# Patient Record
Sex: Female | Born: 1980
Health system: Southern US, Community
[De-identification: ages and names within clinical notes are randomized; demographics above are authoritative.]

## PROBLEM LIST (undated history)

## (undated) DIAGNOSIS — I10 Essential (primary) hypertension: Secondary | ICD-10-CM

## (undated) DIAGNOSIS — R634 Abnormal weight loss: Secondary | ICD-10-CM

## (undated) DIAGNOSIS — R42 Dizziness and giddiness: Secondary | ICD-10-CM

## (undated) DIAGNOSIS — R0789 Other chest pain: Secondary | ICD-10-CM

## (undated) HISTORY — DX: Essential (primary) hypertension: I10

## (undated) HISTORY — DX: Dizziness and giddiness: R42

## (undated) HISTORY — DX: Abnormal weight loss: R63.4

## (undated) HISTORY — DX: Other chest pain: R07.89

## (undated) HISTORY — PX: CARDIAC SURGERY: SHX584

---

## 2011-03-26 HISTORY — PX: ATRIAL SEPTAL DEFECT(ASD) CLOSURE: CATH118299

## 2014-12-15 ENCOUNTER — Ambulatory Visit (INDEPENDENT_AMBULATORY_CARE_PROVIDER_SITE_OTHER): Payer: 59 | Admitting: Physician Assistant

## 2014-12-15 VITALS — BP 146/100 | HR 68 | Temp 98.2°F | Resp 16 | Ht 64.0 in | Wt 132.4 lb

## 2014-12-15 DIAGNOSIS — Q211 Atrial septal defect, unspecified: Secondary | ICD-10-CM

## 2014-12-15 DIAGNOSIS — I10 Essential (primary) hypertension: Secondary | ICD-10-CM | POA: Diagnosis not present

## 2014-12-15 DIAGNOSIS — G44209 Tension-type headache, unspecified, not intractable: Secondary | ICD-10-CM | POA: Diagnosis not present

## 2014-12-15 LAB — BASIC METABOLIC PANEL
BUN: 13 mg/dL (ref 7–25)
CALCIUM: 9.4 mg/dL (ref 8.6–10.2)
CO2: 26 mmol/L (ref 20–31)
CREATININE: 0.62 mg/dL (ref 0.50–1.10)
Chloride: 105 mmol/L (ref 98–110)
GLUCOSE: 81 mg/dL (ref 65–99)
Potassium: 4.3 mmol/L (ref 3.5–5.3)
Sodium: 139 mmol/L (ref 135–146)

## 2014-12-15 MED ORDER — LOSARTAN POTASSIUM 50 MG PO TABS: 50.0000 mg | ORAL_TABLET | Freq: Every day | ORAL | Status: DC

## 2014-12-15 NOTE — Patient Instructions (Signed)
We have started you on losartan 50 mg daily.  We are checking your kidneys and electrolytes today prior to starting this.  I have referred you to cardiology in order to establish care for them for your hypertension and history of ASD.  If your headache worsens or does not resolve in the next few days please come back to see [place].  Please check your blood pressure at home and let us know if it is consistently running above 140/90.

## 2014-12-15 NOTE — Progress Notes (Signed)
   Subjective:    Patient ID: Deanna Davis, female    DOB: 12/21/1980, 34 y.o.   MRN: [id number]  Chief Complaint  Patient presents with  . Headache    x 3 days, recently moved here says she notices it when her BP is high  . blood pressure check    [place] a doctor out of the country but moved before getting on medication   Medications, allergies, past medical history, surgical history, family history, social history and problem list reviewed and updated.  HPI  34 yof presents with htn and HA.   Recently moved here from [place] Had preeclampsia during her only pregnancy 6 yrs ago, had htn post delivery as well. Took losartan for 1-2 years which worked well with no SEs. Stopped taking it several yrs ago as bp was so well controlled.   Today she states bp at home has been running 150s/90s past couple wks. Denies cp, sob. Has had left temporal area HA past 2 days. Gradual onset. Rated 7/10. No radiation. Has been constant but relieved with advil yest. Currently 5/10. No assoc vision changes. No scalp tenderness or jaw claudication. Not worst HA of life. Denies fevers, chills.   PMH - ASD repaired with mesh in [place] 2011.   Review of Systems See HPI     Objective:   Physical Exam  Constitutional: She is oriented to person, place, and time.  BP 146/100 mmHg  Pulse 68  Temp(Src) 98.2 F (36.8 C) (Oral)  Resp 16  Ht  (1.626 m)  Wt 132 lb 6.4 oz (60.056 kg)  BMI 22.72 kg/m2  SpO2 99%  LMP 12/08/2014   HENT:  No scalp tenderness on exam.   Eyes: Conjunctivae and EOM are normal. Pupils are equal, round, and reactive to light.  Neck: No Brudzinski's sign noted.  Cardiovascular: Normal rate, regular rhythm and normal heart sounds.   Pulmonary/Chest: Effort normal and breath sounds normal.  Lymphadenopathy:       Head (right side): No submental, no submandibular and no tonsillar adenopathy present.       Head (left side): No submental, no submandibular and no  tonsillar adenopathy present.    She has no cervical adenopathy.  Neurological: She is alert and oriented to person, place, and time. No cranial nerve deficit.  Skin: Skin is warm and dry. No rash noted.  Psychiatric: She has a normal mood and affect. Her speech is normal and behavior is normal.      Assessment & Plan:   Essential hypertension - Plan: Basic metabolic panel, losartan (COZAAR) 50 MG tablet, Ambulatory referral to Cardiology  Tension-type headache, not intractable, unspecified chronicity pattern  ASD (atrial septal defect) - Plan: Ambulatory referral to Cardiology --will start losartan for htn as has been on previously on tolerated well, bmp today --HA most likey tension or bp related, continue nsaids prn, rtc if no relief 3-4 days or if worsens --referral to cardiology for initial appt as she is new to the area and has htn at 34 age along with a treated ASD --encouraged to see [place] in return for a complete physical, agreeable  Donnajean Lopes, PA-C Physician Assistant-Certified [facility]  12/15/2014 1:16 PM

## 2014-12-24 ENCOUNTER — Ambulatory Visit (INDEPENDENT_AMBULATORY_CARE_PROVIDER_SITE_OTHER): Payer: 59 | Admitting: Family Medicine

## 2014-12-24 VITALS — BP 132/82 | HR 83 | Temp 98.3°F | Resp 14 | Ht 63.5 in | Wt 129.0 lb

## 2014-12-24 DIAGNOSIS — I10 Essential (primary) hypertension: Secondary | ICD-10-CM | POA: Diagnosis not present

## 2014-12-24 MED ORDER — HYDROCHLOROTHIAZIDE 25 MG PO TABS: 25.0000 mg | ORAL_TABLET | Freq: Every day | ORAL | Status: DC

## 2014-12-24 NOTE — Progress Notes (Signed)
   Subjective:  This chart was scribed for Norberto Sorenson, MD by [facility], medical scribe at [facility].The patient was seen in exam room 02 and the patient's care was started at 3:47 PM.   Patient ID: Deanna Davis, female    DOB: 16-Jan-1981, 34 y.o.   MRN: [id number] Chief Complaint  Patient presents with  . Blood Pressure Check  . Headache   HPI HPI Comments: Deanna Davis is a 34 y.o. female with a hx of HTN who presents to [facility] complaining of a headache and elevated BP. Taking losartan 50 mg once daily. BP read at home daily. Initially ranging 140/90 past 3 day high 130/high 80. Taking a contraceptive, no plan on pregnancy. No increased urinary frequency. Appointment with cardiology next week.   Past Medical History  Diagnosis Date  . Hypertension    Current Outpatient Prescriptions on File Prior to Visit  Medication Sig Dispense Refill  . losartan (COZAAR) 50 MG tablet Take 1 tablet (50 mg total) by mouth daily. 90 tablet 3   No current facility-administered medications on file prior to visit.   No Known Allergies  Review of Systems  Genitourinary: Negative for frequency.  Neurological: Positive for headaches.      Objective:  BP 132/82 mmHg  Pulse 83  Temp(Src) 98.3 F (36.8 C) (Oral)  Resp 14  Ht 5' 3.5" (1.613 m)  Wt 129 lb (58.514 kg)  BMI 22.49 kg/m2  SpO2 99%  LMP 12/08/2014 Physical Exam  Constitutional: She is oriented to person, place, and time. She appears well-developed and well-nourished. No distress.  HENT:  Head: Normocephalic and atraumatic.  Eyes: Pupils are equal, round, and reactive to light.  Neck: Normal range of motion. Neck supple. No thyromegaly present.  Cardiovascular: Normal rate, regular rhythm, S1 normal, S2 normal and normal heart sounds.  Exam reveals no gallop and no friction rub.   No murmur heard. Pulmonary/Chest: Effort normal and breath sounds normal. No respiratory distress.    Genitourinary: No vaginal discharge found.  Musculoskeletal: Normal range of motion.  Lymphadenopathy:    She has no cervical adenopathy.  Neurological: She is alert and oriented to person, place, and time.  Skin: Skin is warm and dry.  Psychiatric: She has a normal mood and affect. Her behavior is normal.  Nursing note and vitals reviewed.     Assessment & Plan:   1. Essential hypertension   Continue losartan 50, start hctz 25. Lots of water, cont low salt diet, increase potassium in diet - info given.  Reviewed teratogenicity of losartan - pt aware and is very sure she will not become pregnant.  Cont to monitor BP at home - goal 110/70 due to young age, lack of other risk factors, and prior ASD repair. Has appt w/ cardiology sched for 9/19 of which pt is aware.  Can combine to combo pill when stable on regimen.  Pt's primary language is Spanish but speaks [place] well  Meds ordered this encounter  Medications  . hydrochlorothiazide (HYDRODIURIL) 25 MG tablet    Sig: Take 1 tablet (25 mg total) by mouth daily.    Dispense:  30 tablet    Refill:  2    I personally performed the services described in this documentation, which was scribed in my presence. The recorded information has been reviewed and considered, and addended by me as needed.  Norberto Sorenson, MD MPH

## 2014-12-24 NOTE — Patient Instructions (Signed)
Continue taking the losartan every morning and start taking the hctz every morning as well.  We want your blood pressure to be around 110/70.  Make sure you are drinking plenty of water and getting lots of high potassium foods in your diet.  Remember to keep on reading the sodium labels on your food.  Good job on keeping the BP log - continue this.  When you are on a good dose of medication and your blood pressure is where we want it and you are not having medication side effects, we can combine your blood pressure pills into 1 usually so that you can be on the same medications but only 1 pill a day/ 1 copay a month. Remember to stay on birth control - getting pregnant on losartan causes fetal malformations that can be deadly to the fetus  Contenido de potasio de los alimentos (Potassium Content of Foods) El potasio es un mineral que se encuentra en muchos alimentos y bebidas. Ayuda a Radio producer equilibrio de lquidos y Kellogg organismo, e influye en la regularidad con la que el corazn late. El potasio tambin ayuda a Chief Operating Officer la presin arterial y a Programme researcher, broadcasting/film/video y Nelson. Algunos medicamentos y afecciones pueden cambiar el equilibrio de potasio en el cuerpo. Cuando esto sucede, puede ayudar a Boeing de potasio a travs de los alimentos que consume. El mdico o el nutricionista le recomendarn la cantidad de potasio que debe ingerir por Futures trader. Las siguientes listas proporcionan la cantidad de potasio (entre parntesis) por porcin en cada alimento. CON ALTO CONTENIDO DE POTASIO  Los siguientes alimentos y bebidas tienen 200 mg o ms de potasio por porcin:  Damascos, 2 crudos o 5 secos (200 mg)  Alcachofa, 1 mediana (345 mg)  Aguacate, crudo, 1/4 (245 mg)  Banana, 1 mediana (425 mg)  Frijoles, lima o frijoles en salsa de tomates, enlatados, 1/2 taza (280 mg)  Frijoles blancos, enlatados, 1/2 taza (595 mg)  Carne asada, 3 onzas/85 g (320  mg)  Carne molida, 3 onzas/85 g (270 mg)  Remolachas, crudas o cocidas, 1/2 taza (260 mg)  Bollitos de salvado, 2 onzas/57 g (300 mg)  Brcoli, 1/2 taza (230 mg)  Repollitos de Bruselas, 1/2 taza (250 mg)  Meln, 1/2 taza (215 mg)  Cereales, 100 % de salvado, 1/2 taza (200 a 400 mg)  Hamburguesa de queso, sola, comida rpida, 1 (225 a 400 mg)  Pollo, 3 onzas/85 g (220 mg)  Almejas, enlatadas, 3 onzas/85 g (535 mg)  Cangrejo, 3 onzas/85 g (225 mg)  Dtiles, 5 (270 mg)  Frijoles y guisantes secos, 1/2 taza (300 a 475 mg)  Higos, secos, 2 (260 mg)  Pescado: halibut, atn, bacalao, pargo, 3 onzas/85 g (480 mg)  Pescado: salmn, abadejo, pez espada, perca, 3 onzas/85 g (300 mg)  Pescado: atn, enlatado, 3 onzas/85 g (200 mg)  Papas fritas, comida rpida, 3 onzas/85 g (470 mg)  Granola con frutas y frutos secos, 1/2 taza (200 mg)  Jugo de pomelo, 1/2 taza (200 mg)  Hojas verdes de Psychiatric nurse, 1/2 taza (655 mg)  Meln dulce, 1/2 taza (200 mg)  Col rizada, 1 taza (300 mg)  Kiwi, 1 mediano (240 mg)  Colirrbano, colinabo, chiriva, 1/2 taza (280 mg)  Lentejas, 1/2 taza (365 mg)  Mango, 1 (325 mg)  Leche con chocolate, 1 taza (420 mg)  Leche: descremada, parcialmente descremada, entera, suero de [place], 1 taza (350 a 380 mg)  Melaza, 1 cucharada (  295 mg)  Championes, 1/2 taza (280 mg)  Nectarina, 1 (275 mg)  Frutos secos: almendras, manes, avellanas, nueces de [place], castaas de caj, mezclados, 1 onza/28 g (200 mg)  Frutos secos: pistachos, 1 onza/28 g (295 mg)  Naranja, 1 (240 mg)  Jugo de naranja, 1/2 taza (235 mg)  Papaya, mediana, 1/2 fruta (390 mg)  Mantequilla de man, con trozos, 2 cucharadas (240 mg)  Mantequilla de man, sin trozos, 2 cucharadas (210 mg)  Pera, 1 mediana (200 mg)  Granada, 1 entera (400 mg)  Jugo de granada, 1/2 taza (215 mg)  Cerdo, 3 onzas/85 g (350 mg)  Papas fritas (de bolsa), 1 onza/85 g (465 mg)  Papa,  asada, con piel, 1 mediana (925 mg)  Papas, hervidas, 1/2 taza (255 mg)  Papas, pur, 1/2 taza (330 mg)  Jugo de ciruela, 1/2 taza (370 mg)  Ciruelas, 5 (305 mg)  Pudin de chocolate, 1/2 taza (230 mg)  Calabaza, enlatada, 1/2 taza (250 mg)  Pasas de uva, sin semilla, 1/4 taza (270 mg)  Semillas de girasol o calabaza, 1 onza/28 g (240 mg)  Leche de soja, 1 taza (300 mg)  Espinaca, 1/2 taza (420 mg)  Espinaca, enlatada, 1/2 taza (370 mg)  Batata, asada, con piel, 1 mediana (450 mg)  Acelga suiza, 1/2 taza (480 mg)  Jugo de tomate o verduras, 1/2 taza (275 mg)  Salsa o pur de tomates, 1/2 taza (400 a 550 mg)  Tomate, crudo, 1 mediano (290 mg)  Tomates, enlatados, 1/2 taza (200 a 300 mg)  Pavo, 3 onzas/85 g (250 mg)  Germen de trigo, 1 onza/28 g (250 mg)  Calabaza de invierno, 1/2 taza (250 mg)  Yogur, comn o con frutas, 6 onzas/177 ml (260 a 435 mg)  Calabacn, 1/2 taza (220 mg) CON CONTENIDO MODERADO DE POTASIO Los siguientes alimentos y bebidas tienen entre 50 y 200 mg de potasio por porcin:  Manzana, 1 (150 mg)  Jugo de [place], 1/2 taza (150 mg)  Pur de Psychologist, educational, 1/2 taza (90 mg)  Nctar de damasco, 1/2 taza (140 mg)  Esprragos, pequeos, 1/2 taza o 6 esprragos (155 mg)  Bagel con canela y pasas de uva, 1 (130 mg)  Bagel con huevo o comn, 4 pulgada/10 cm, 1 (70 mg)  Frijoles verdes, 1/2 taza (90 mg)  Frijoles amarillos, 1/2 taza (190 mg)  Cerveza, regular, 12 onzas/355 ml (100 mg)  Remolachas, enlatadas, 1/2 taza (125 mg)  Moras, 1/2 taza (115 mg)  Arndanos, 1/2 taza (60 mg)  Pan integral, 1 rebanada (70 mg)  Brcoli, crudo, 1/2 taza (145 mg)  Repollo, 1/2 taza (150 mg)  Zanahorias, crudas o cocidas, 1/2 taza (180 mg)  Coliflor, cruda, 1/2 taza (150 mg)  Apio, crudo, 1/2 taza (155 mg)  Cereales, copos de salvado, 1/2 taza (120 a 150 mg)  Requesn, 1/2 taza (110 mg)  Cerezas, 10 (150 mg)  Chocolate, barra de 1 1/2  onza/43 g (165 mg)  Caf, de cafetera, 6 onzas/177 ml (90 mg)  Maz, 1/2 taza o 1 espiga (195 mg)  Pepinos, 1/2 taza (80 mg)  Huevo, grande, 1 (60 mg)  Berenjena, 1/2 taza (60 mg)  Endivia, cruda, 1/2 taza (80 mg)  Muffin ingls, 1 (65 mg)    Cctel de frutas, 1/2 taza (115 mg)  Jugo de uvas, 1/2 taza (170 mg)  Pomelo, 1/2 (175 mg)  Uvas, 1/2 taza (155 mg)  Hojas verdes: col rizada, nabo, col, 1/2 taza (110 a 150 mg)  Helado  o yogur helado, de chocolate, 1/2 taza (175 mg)  Helado o yogur helado, de vainilla, 1/2 taza (120 a 150 mg)  Limones, limas, 1 (80 mg)  Lechuga, todos los tipos, 1 taza (100 mg)  Terex Corporation, 1/2 taza (150 mg)  Championes, crudos, 1/2 taza (110 mg)  Frutos secos: nueces, pacanas o macadamia, 1 onza/28 g (125mg )  Avena, 1/2 taza (80 mg)  Quimbomb, 1/2 taza (110 mg)  Cebolla, cruda, 1/2 taza (120 mg)  Durazno, 1 (185 mg)  Duraznos, enlatados, 1/2 taza (120 mg)  Peras, enlatadas, 1/2 taza (120 mg)  Guisantes, congelados, 1/2 taza (90 mg)  Pimientos, verdes, 1/2 taza (130 mg)  Pimientos, rojos, 1/2 taza (160 mg)  Jugo de pia, 1/2 taza (165 mg)  Pia, fresca o enlatada, 1/2 taza (100 mg)  Ciruelas, 1 (105 mg)  Pudin de vainilla, 1/2 taza (150 mg)  Frambuesas, 1/2 taza (90 mg)  Ruibardo, 1/2 taza (115 mg)  Arroz salvaje, 1/2 taza (80 mg)  Camarones, 3 onzas/85 g (155 mg)  Espinaca, cruda, 1 taza (170 mg)  Fresas, 1/2 taza (125 mg)  Calabaza de verano, 1/2 taza (175 a 200 mg)  Acelga suiza, cruda, 1 taza (135 mg)  Mandarina, 1 (140 mg)  T, 6 onzas/177 ml (65 mg)  Nabos, 1/2 taza (140 mg)  Sanda, 1/2 taza (85 mg)  Vino tinto, de mesa, 5 onzas/148 ml (180 mg)  Vino blanco, de mesa, 5 onzas/148 ml (100 mg) CON BAJO CONTENIDO DE POTASIO Los siguientes alimentos y bebidas tienen menos de 50 mg de potasio por porcin:  Pan blanco, 1 rebanada (30 g)  Bebidas gaseosas, 12 onzas/355 ml (menos de 5  mg)  Queso, 1 onza/28 g (20 a 30 mg)  Arndanos rojos, 1/2 taza (45 mg)  Cctel con jugo de arndanos rojos, 1/2 taza (20 mg)  Grasas y aceites, 1 cucharada (menos de 5 mg)  Hummus, 1 cucharada (32 mg)  Nctar: papaya, mango o pera, 1/2 taza (35 mg)  Arroz blanco o integral, 1/2 taza (50 mg)  Espaguetis o macarrones, cocidos, 1/2 taza (30 mg)  Tortilla de harina o maz, 1 (50 mg)  Waffle, 4 pulgadas/10 cm, 1 (50 mg)  Castaas de agua, 1/2 taza (40 mg) Document Released: 01/04/2012 Document Revised: 04/16/2013 [facility] Patient Information 2015 [place], [place]. This information is not intended to replace advice given to you by your health care provider. Make sure you discuss any questions you have with your health care provider.   Controle su presin arterial (Managing Your High Blood Pressure) La presin arterial es la medida de la fuerza de la sangre al presionar contra las paredes de las arterias. Las arterias son tubos musculares que estn dentro del sistema circulatorio. La presin arterial no es constante. Se eleva con la actividad, la excitacin o el nerviosismo y disminuye durante el sueo y Facilities manager. Si los valores de medicin de la presin arterial se mantienen por arriba de lo normal por [employer], hay riesgo de [address] La presin arterial alta (hipertensin) es una enfermedad de larga duracin (crnica) en la que la presin arterial est elevada.  La lectura de la presin arterial se registra con dos nmeros, por ejemplo 120 sobre 80 (o 120/80). El primer nmero, el ms alto, es la presin sistlica. Es la medida de la presin de las arterias cuando el corazn late. El segundo nmero, el ms bajo, es la presin diastlica. Es la medida de la presin en las arterias cuando  el corazn se relaja entre latidos.  Es importante Photographer presin arterial en un rango normal para Personal assistant en general y otros problemas de salud, como enfermedades  del corazn e ictus. Cuando no se controla la presin arterial, el corazn trabaja ms de lo normal. La hipertensin arterial es una enfermedad muy comn en los adultos debido a que tiende a Administrator, Civil Service con la edad. Hombres y mujeres son igualmente propensos a tener hipertensin, pero en diferentes momentos de la vida. Antes de los 45 aos, los hombres son ms propensos a sufrir hipertensin. Despus de 65 aos de edad, las mujeres tienen ms probabilidades de padecerla. La hipertensin es [employer] afroamericanos. Esta enfermedad generalmente no manifiesta signos ni sntomas. Generalmente se desconoce la causa. El mdico podr indicarle un plan para mantener la presin arterial en un rango normal y saludable.  ETAPAS DE PRESIN ARTERIAL La presin arterial se clasifica en cuatro etapas: normal, prehipertensin, etapa 1 y etapa 2. Se puede leer la presin arterial para determinar qu tipo de tratamiento, si se indicara, es necesario. Las opciones apropiadas para el tratamiento estn vinculadas a estas cuatro etapas:  Normal   Presin sistlica (mm Hg): por debajo de 120.  Presin diastlica (mm Hg): por debajo de 80. Prehipertensin   Presin sistlica (mm Hg): 120 a 139.  Presin diastlica (mm Hg): 80 a 89. Etapa1   Presin sistlica (mm Hg): 140 a 159.  Presin diastlica (mm Hg): 90 a 99. Etapa2   Presin sistlica (mm Hg): 160 o ms.  Presin diastlica (mm Hg): 100 o ms. RIESGOS RELACIONADOS CON LA PRESIN ARTERIAL ALTA Controlar la presin arterial es una responsabilidad importante. La hipertensin no controlada puede llevar a:   Ataques cardacos.  Ictus.  Debilitamiento de los vasos sanguneos (aneurisma).  Insuficiencia cardaca.  Dao renal.  Dao ocular.  Sndrome metablico.  Problemas de memoria y concentracin. CMO CONTROLAR LA PRESIN ARTERIAL La presin arterial puede ser controlada efectivamente con cambios en el estilo de vida y de medicamentos (si  es necesario). El Firefighter un plan para bajar la presin arterial al rango normal. Su plan debera incluir lo siguiente:  Educacin   Lea toda la informacin proporcionada por sus mdicos acerca de cmo controlar la presin arterial.  Infrmese sobre las ltimas recomendaciones de pautas y [place]. Continuamente se hacen nuevas investigaciones para definir con ms precisin los riesgos y los tratamientos para la hipertensin arterial. Cambiosen el estilo de vida  Control del [place].  No fumar.  Mantenerse fsicamente activo.  Disminuir la cantidad de sal de la dieta.  Reducir las situaciones de estrs.  Controlar las enfermedades crnicas, como el colesterol alto o la diabetes.  Reducir el consumo de alcohol. Medicamentos  Estn disponibles diferentes medicamentos (medicamentos antihipertensivos) para que la presin arterial quede dentro de un rango normal. Comunicacin   Revise con su mdico todos los medicamentos que toma ya que puede haber efectos secundarios o interacciones.  Hable con su mdico acerca de la dieta, hbitos de ejercicio y otros factores del estilo de vida que pueden contribuir a la hipertensin arterial.  Oceanographer regularmente a la consulta con el profesional. El mdico puede ayudarle a crear y Dawayne Patricia su plan para controlar la presin arterial alta. RECOMENDACIONES PARA EL TRATAMIENTO Y CONTROL  Las siguientes recomendaciones se basan en las pautas actuales para controlar la hipertensin arterial en adultos no gestantes. Utilice estas recomendaciones para determinar el perodo de seguimiento adecuado o la opcin de tratamiento basada  en la lectura de su presin arterial. Podr conversar sobre estas opciones con su mdico.   Presin sistlica de 120 a 139 o presin diastlica de 80 a 89: Concurra a las visitas de control, segn las indicaciones.  Presin sistlica de 140 a 160 o presin diastlica de 90 a 100: Haga una visita de control con el  profesional dentro de [address]  Presin sistlica por arriba de 160 o presin diastlica por arriba de 100: Haga una visita de control con el profesional dentro de un mes.  Presin sistlica por arriba de 180 o presin diastlica por arriba de 110: Considere la posibilidad de seguir una terapia antihipertensiva; concurra a una visita de control con su mdico dentro de 1 semana.  Presin sistlica por arriba de 200 o presin diastlica por arriba de 120: Comience el tratamiento antihipertensivo; concurra una visita de control con su mdico dentro de 1 semana. Document Released: 01/04/2012 [facility] Patient Information 2015 [place], [place]. This information is not intended to replace advice given to you by your health care provider. Make sure you discuss any questions you have with your health care provider. Plan de alimentacin DASH (DASH Eating Plan) DASH es la sigla en ingls de "Enfoques Alimentarios para Detener la Hipertensin". El plan de alimentacin DASH ha demostrado bajar la presin arterial elevada (hipertensin). Los beneficios adicionales para la salud pueden incluir la disminucin del riesgo de diabetes mellitus tipo2, enfermedades cardacas e ictus. Este plan tambin puede ayudar a Geophysical data processor. QU DEBO SABER ACERCA DEL PLAN DE ALIMENTACIN DASH? Para el plan de alimentacin DASH, seguir las siguientes pautas generales:  Elija los alimentos con un valor porcentual diario de sodio de menos del 5% (segn figura en la etiqueta del alimento).  Use hierbas o aderezos sin sal, en lugar de sal de mesa o sal marina.  Consulte al mdico o farmacutico antes de usar sustitutos de la sal.  Coma productos con bajo contenido de sodio, cuya etiqueta suele decir "bajo contenido de sodio" o "sin agregado de sal".  Coma alimentos frescos.  Coma ms verduras, frutas y productos lcteos con bajo contenido de [place].  Elija los cereales integrales. Busque la palabra "integral" en Publishing rights manager de la lista de ingredientes.  Elija el pescado y el pollo o el pavo sin piel ms a menudo que las carnes rojas. Limite el consumo de pescado, carne de ave y carne a 6onzas (170g) por Futures trader.  Limite el consumo de dulces, postres, azcares y bebidas azucaradas.  Elija las grasas saludables para el corazn.  Limite el consumo de queso a 1onza (28g) por Futures trader.  Consuma ms comida casera y menos de restaurante, de buf y comida rpida.  Limite el consumo de alimentos fritos.  Cocine los alimentos utilizando mtodos que no sean la fritura.  Limite las verduras enlatadas. Si las consume, enjuguelas bien para disminuir el sodio.  Cuando coma en un restaurante, pida que preparen su comida con menos sal o, en lo posible, sin nada de sal. QU ALIMENTOS PUEDO COMER? Pida ayuda a un nutricionista para conocer las necesidades calricas individuales. Cereales Pan de salvado o integral. Arroz integral. Pastas de salvado o integrales. Quinua, trigo burgol y cereales integrales. Cereales con bajo contenido de sodio. Tortillas de harina de maz o de salvado. Pan de maz integral. Galletas saladas integrales. Galletas con bajo contenido de [place]. Vegetales Verduras frescas o congeladas (crudas, al vapor, asadas o grilladas). Jugos de tomate y verduras con contenido bajo o reducido de sodio. Pasta  y [place] de tomate con contenido bajo o reducido de sodio. Verduras enlatadas con bajo contenido de sodio o reducido de sodio.  Nils Pyle Nils Pyle frescas, en conserva (en su jugo natural) o frutas congeladas. Carnes y otros productos con protenas Carne de res molida (al 85% o ms [place]), carne de res de animales alimentados con pastos o carne de res sin la grasa. Pollo o pavo sin piel. Carne de pollo o de [place]. Cerdo sin la grasa. Todos los pescados y frutos de mar. Huevos. Porotos, guisantes o lentejas secos. Frutos secos y semillas sin sal. Frijoles enlatados sin sal. Lcteos Productos lcteos con  bajo contenido de grasas, como [place] o al 1%, quesos reducidos en grasas o al 2%, ricota con bajo contenido de grasas o [employer], o yogur natural con bajo contenido de [place]. Quesos con contenido bajo o reducido de sodio. Grasas y Writer en barra que no contengan grasas trans. [place] y alios para ensaladas livianos o reducidos en grasas (reducidos en sodio). Aguacate. Aceites de crtamo, oliva o canola. Mantequilla natural de man o almendra. Otros Palomitas de maz y pretzels sin sal. Los artculos mencionados arriba pueden no ser [employer] de las bebidas o los alimentos recomendados. Comunquese con el nutricionista para conocer ms opciones. QU ALIMENTOS NO SE RECOMIENDAN? Cereales Pan blanco. Pastas blancas. Arroz blanco. Pan de maz refinado. Bagels y croissants. Galletas saladas que contengan grasas trans. Vegetales Vegetales con crema o fritos. Verduras en salsa de Tensed. Verduras enlatadas comunes. Pasta y salsa de tomate en lata comunes. Jugos comunes de tomate y de verduras. Nils Pyle Frutas secas. Fruta enlatada en almbar liviano o espeso. Jugo de frutas. Carnes y otros productos con protenas [place] de carne con Holiday representative. Costillas, alas de pollo, tocineta, salchicha, mortadela, salame, chinchulines, tocino, perros calientes, salchichas alemanas y embutidos envasados. Frutos secos y semillas con sal. Frijoles con sal en lata. Lcteos Leche entera o al 2%, crema, mezcla de [place] y crema, y queso crema. Yogur entero o endulzado. Quesos o queso azul con alto contenido de Neurosurgeon. [place] no lcteas y coberturas batidas. Quesos procesados, quesos para untar o cuajadas. Condimentos Sal de cebolla y ajo, sal condimentada, sal de mesa y sal marina. Salsas en lata y envasadas. Salsa Worcestershire. Salsa trtara. Salsa barbacoa. Salsa teriyaki. Salsa de soja, incluso la que tiene contenido reducido de [place]. Salsa de carne. Salsa de pescado. Salsa de  [place]. Salsa rosada. Rbano picante. Ketchup y mostaza. Saborizantes y tiernizantes para carne. Caldo en cubitos. Salsa picante. Salsa tabasco. Adobos. Aderezos para tacos. Salsas. Grasas y [address], [place] en barra, [place] de [place], [place], [place] clarificada y Steffanie [place] de tocino. Aceites de coco, de palmiste o de palma. Aderezos comunes para ensalada. Otros Pickles y [place]. Palomitas de maz y pretzels con sal. Los artculos mencionados arriba pueden no ser [employer]on de las bebidas y los alimentos que se Theatre stage manager. Comunquese con el nutricionista para obtener ms informacin. DNDE Raelyn Mora MS INFORMACIN? Instituto Nacional del [place], del Pulmn y de la Sangre (National Heart, Lung, and Blood Institute): [email or web address] Document Released: 03/31/2011 Document Revised: 08/26/2013 [facility] Patient Information 2015 [place], [place]. This information is not intended to replace advice given to you by your health care provider. Make sure you discuss any questions you have with your health care provider.

## 2015-01-11 NOTE — Progress Notes (Signed)
This encounter was created in error - please disregard.

## 2015-01-12 ENCOUNTER — Encounter: Payer: 59 | Admitting: Cardiovascular Disease

## 2015-03-24 ENCOUNTER — Other Ambulatory Visit: Payer: Self-pay | Admitting: Family Medicine

## 2015-04-25 ENCOUNTER — Other Ambulatory Visit: Payer: Self-pay | Admitting: Family Medicine

## 2015-06-01 ENCOUNTER — Ambulatory Visit (INDEPENDENT_AMBULATORY_CARE_PROVIDER_SITE_OTHER): Payer: 59 | Admitting: Cardiovascular Disease

## 2015-06-01 ENCOUNTER — Encounter: Payer: Self-pay | Admitting: Cardiovascular Disease

## 2015-06-01 VITALS — BP 106/70 | HR 76 | Ht 64.0 in | Wt 129.8 lb

## 2015-06-01 DIAGNOSIS — Q211 Atrial septal defect, unspecified: Secondary | ICD-10-CM

## 2015-06-01 DIAGNOSIS — R634 Abnormal weight loss: Secondary | ICD-10-CM | POA: Insufficient documentation

## 2015-06-01 DIAGNOSIS — R5383 Other fatigue: Secondary | ICD-10-CM

## 2015-06-01 DIAGNOSIS — R42 Dizziness and giddiness: Secondary | ICD-10-CM | POA: Insufficient documentation

## 2015-06-01 DIAGNOSIS — Z9889 Other specified postprocedural states: Secondary | ICD-10-CM | POA: Diagnosis not present

## 2015-06-01 DIAGNOSIS — I10 Essential (primary) hypertension: Secondary | ICD-10-CM

## 2015-06-01 DIAGNOSIS — Z8774 Personal history of (corrected) congenital malformations of heart and circulatory system: Secondary | ICD-10-CM

## 2015-06-01 HISTORY — DX: Dizziness and giddiness: R42

## 2015-06-01 HISTORY — DX: Abnormal weight loss: R63.4

## 2015-06-01 LAB — CBC
HCT: 40.4 % (ref 36.0–46.0)
HEMOGLOBIN: 13.6 g/dL (ref 12.0–15.0)
MCH: 30.8 pg (ref 26.0–34.0)
MCHC: 33.7 g/dL (ref 30.0–36.0)
MCV: 91.4 fL (ref 78.0–100.0)
MPV: 10.2 fL (ref 8.6–12.4)
Platelets: 366 10*3/uL (ref 150–400)
RBC: 4.42 MIL/uL (ref 3.87–5.11)
RDW: 13.4 % (ref 11.5–15.5)
WBC: 10.2 10*3/uL (ref 4.0–10.5)

## 2015-06-01 NOTE — Patient Instructions (Signed)
Your physician recommends that you return for lab work TODAY.  Your physician has requested that you have an echocardiogram. Echocardiography is a painless test that uses sound waves to create images of your heart. It provides your doctor with information about the size and shape of your heart and how well your heart's chambers and valves are working. This procedure takes approximately one hour. There are no restrictions for this procedure. This will be done at our [facility] location - [address].  Dr Duke Salvia recommends that you schedule a follow-up appointment in 1 year. You will receive a reminder letter in the mail two months in advance. If you don't receive a letter, please call our office to schedule the follow-up appointment.  If you need a refill on your cardiac medications before your next appointment, please call your pharmacy.

## 2015-06-01 NOTE — Progress Notes (Signed)
Cardiology Office Note   Date:  06/01/2015   ID:  Deanna Davis, DOB April 27, 1980, MRN [id number]  PCP:  No primary care provider on file.  Cardiologist:   Madilyn Hook, MD   Chief Complaint  Patient presents with  . New Evaluation    Essential hypertension; ASD//Referred by Dr. Mcveigh//pt c/o headaches, occasional dizziness, no other Sx.      History of Present Illness: Deanna Davis is a 35 y.o. female with hypertension and an ASD who presents to establish he saw Dr.Evan Clelia Croft on 11/3114. At that time her blood pressure was poorly controlled and hydrochlorothiazide was added to her losartan.  She reports that her blood pressure has been much better controlled since that time.  Her only complaint is frequent headaches and occasional episodes of dizziness and blurred vision.  This occurs very sporadically.  The last episode was 9 months ago.  The episodes last approximately 5 minutes at a time and are sometimes followed by a headache, but not always.  She denies preceding palpitations or chest pain and she denies syncope.  She also reports a 13 lb unintended weight loss since May.  She has not changed her diet and does not exercise.  She denies tremor, constipation, diarrhea, palpitations, hot or cold intolerance.  She also denies changes in her hair, skin or nails, though she had a problem with hair loss one year ago.  This has since resolved.    Deanna Davis had a secundum ASD repaired with a 30mm Amplatzer device on 03/25/10.  She moved to [place] from [place] four months ago due to her husband's job.  They have a 6 year old son.   Past Medical History  Diagnosis Date  . Hypertension   . Dizziness 06/01/2015  . Weight loss 06/01/2015    No past surgical history on file.   Current Outpatient Prescriptions  Medication Sig Dispense Refill  . hydrochlorothiazide (HYDRODIURIL) 25 MG tablet TAKE 1 TABLET (25 MG TOTAL)  BY MOUTH DAILY 30 tablet 0  . losartan (COZAAR) 50 MG  tablet Take 1 tablet (50 mg total) by mouth daily. 90 tablet 3   No current facility-administered medications for this visit.    Allergies:   Review of patient's allergies indicates no known allergies.    Social History:  The patient  reports that she has never smoked. She does not have any smokeless tobacco history on file.   Family History:  The patient's family history includes Hypertension in her mother.    ROS:  Please see the history of present illness.   Otherwise, review of systems are positive for none.   All other systems are reviewed and negative.    PHYSICAL EXAM: VS:  BP 106/70 mmHg  Pulse 76  Ht  (1.626 m)  Wt 58.877 kg (129 lb 12.8 oz)  BMI 22.27 kg/m2 , BMI Body mass index is 22.27 kg/(m^2). GENERAL:  Well appearing HEENT:  Pupils equal round and reactive, fundi not visualized, oral mucosa unremarkable NECK:  No jugular venous distention, waveform within normal limits, carotid upstroke brisk and symmetric, no bruits, no thyromegaly LYMPHATICS:  No cervical adenopathy LUNGS:  Clear to auscultation bilaterally HEART:  RRR.  PMI not displaced or sustained,S1 and S2 within normal limits, no S3, no S4, no clicks, no rubs, no murmurs ABD:  Flat, positive bowel sounds normal in frequency in pitch, no bruits, no rebound, no guarding, no midline pulsatile mass, no hepatomegaly, no splenomegaly EXT:  2 plus pulses throughout, no edema, no cyanosis no clubbing SKIN:  No rashes no nodules NEURO:  Cranial nerves II through XII grossly intact, motor grossly intact throughout PSYCH:  Cognitively intact, oriented to person place and time    EKG:  EKG is ordered today. The ekg ordered today demonstrates sinus rhythm.  Rate 76 bpm.  Incomplete RBBB.    Echo 01/24/12: LVEF >55%.  Repaired secundum ASD (Amplatzer) without residual shunt.  Trivial PR, MR.  Mild TR.    Recent Labs: 12/15/2014: BUN 13; Creat 0.62; Potassium 4.3; Sodium 139    Lipid Panel No results found  for: CHOL, TRIG, HDL, CHOLHDL, VLDL, LDLCALC, LDLDIRECT    Wt Readings from Last 3 Encounters:  06/01/15 58.877 kg (129 lb 12.8 oz)  12/24/14 58.514 kg (129 lb)  12/15/14 60.056 kg (132 lb 6.4 oz)      ASSESSMENT AND PLAN:  # ASD s/p 30mm Amplatzer closure device: No evidence of right heart failure.  Last echo was in 2013 and the device was stable.  She denies chest pain or shortness of breath.  We will obtain an echo to establish a baseline.  She no longer requires antibiotic prophylaxis prior to dental procedures.   # Hypertension:  Blood pressure well-controlled.  Continue HCTZ and losartan.  # Dizziness: It is unclear what is causing Deanna Davis's dizziness.  The episodes occur infrequently and are not associated with syncope or cardiac symptoms.  We will check orthostatic vital signs.  It is unlikely that this is the cause, as her symptoms preceded the addition of her HCTZ. (She was not orthostatic in clinic today)  # Weight loss: Deanna Davis reports a 13lb unintentional weight loss.  We will check a TSH, free T4 and CBC.  Current medicines are reviewed at length with the patient today.  The patient does not have concerns regarding medicines.  The following changes have been made:  no change  Labs/ tests ordered today include:   Orders Placed This Encounter  Procedures  . T4, free  . TSH  . CBC  . EKG 12-Lead  . ECHOCARDIOGRAM COMPLETE     Disposition:   FU with Sriman Tally C. Duke Salvia, MD, [facility] in 1 year.    This note was written with the assistance of speech recognition software.  Please excuse any transcriptional errors.  Signed, Markiyah Gahm C. Duke Salvia, MD, [facility]  06/01/2015 3:58 PM    [facility]

## 2015-06-02 ENCOUNTER — Telehealth: Payer: Self-pay | Admitting: *Deleted

## 2015-06-02 LAB — T4, FREE: Free T4: 1.1 ng/dL (ref 0.8–1.8)

## 2015-06-02 LAB — TSH: TSH: 0.71 m[IU]/L

## 2015-06-02 NOTE — Telephone Encounter (Signed)
-----   Message from Chilton Si, MD sent at 06/02/2015  8:09 AM EST ----- Normal thyroid function and blood counts.

## 2015-06-02 NOTE — Telephone Encounter (Signed)
Spoke to patient. Result given . Verbalized understanding  

## 2015-06-15 ENCOUNTER — Ambulatory Visit (HOSPITAL_COMMUNITY): Payer: 59 | Attending: Cardiovascular Disease

## 2015-06-15 ENCOUNTER — Other Ambulatory Visit: Payer: Self-pay

## 2015-06-15 DIAGNOSIS — I10 Essential (primary) hypertension: Secondary | ICD-10-CM | POA: Insufficient documentation

## 2015-06-15 DIAGNOSIS — Q211 Atrial septal defect, unspecified: Secondary | ICD-10-CM

## 2015-06-15 DIAGNOSIS — Z8774 Personal history of (corrected) congenital malformations of heart and circulatory system: Secondary | ICD-10-CM | POA: Insufficient documentation

## 2015-06-15 DIAGNOSIS — Z9889 Other specified postprocedural states: Secondary | ICD-10-CM | POA: Insufficient documentation

## 2015-06-22 ENCOUNTER — Telehealth: Payer: Self-pay | Admitting: *Deleted

## 2015-06-22 NOTE — Telephone Encounter (Signed)
-----   Message from Chilton Si, MD sent at 06/19/2015  9:20 AM EST ----- Normal echo.

## 2015-06-22 NOTE — Telephone Encounter (Signed)
Phone answer machine has been not set up yet Will try again

## 2015-07-03 ENCOUNTER — Encounter: Payer: Self-pay | Admitting: *Deleted

## 2015-07-03 NOTE — Telephone Encounter (Signed)
Called--Unable to leave message Letter mailed with results

## 2015-07-05 ENCOUNTER — Other Ambulatory Visit: Payer: Self-pay | Admitting: Family Medicine

## 2015-08-12 ENCOUNTER — Other Ambulatory Visit: Payer: Self-pay | Admitting: Family Medicine

## 2015-09-05 ENCOUNTER — Other Ambulatory Visit: Payer: Self-pay | Admitting: Family Medicine

## 2016-01-28 ENCOUNTER — Other Ambulatory Visit: Payer: Self-pay | Admitting: Physician Assistant

## 2016-01-28 DIAGNOSIS — I10 Essential (primary) hypertension: Secondary | ICD-10-CM

## 2016-02-02 ENCOUNTER — Other Ambulatory Visit: Payer: Self-pay | Admitting: Cardiovascular Disease

## 2016-02-02 DIAGNOSIS — I10 Essential (primary) hypertension: Secondary | ICD-10-CM

## 2016-02-02 NOTE — Telephone Encounter (Signed)
Review for refill. 

## 2016-09-28 ENCOUNTER — Encounter (HOSPITAL_COMMUNITY): Payer: Self-pay | Admitting: Emergency Medicine

## 2016-09-28 ENCOUNTER — Ambulatory Visit (HOSPITAL_COMMUNITY)
Admission: EM | Admit: 2016-09-28 | Discharge: 2016-09-28 | Disposition: A | Discharge status: Nursing Home (Includes ICF) | Payer: 59 | Attending: Internal Medicine | Admitting: Internal Medicine

## 2016-09-28 DIAGNOSIS — Z3202 Encounter for pregnancy test, result negative: Secondary | ICD-10-CM

## 2016-09-28 DIAGNOSIS — I10 Essential (primary) hypertension: Secondary | ICD-10-CM | POA: Diagnosis not present

## 2016-09-28 DIAGNOSIS — R509 Fever, unspecified: Secondary | ICD-10-CM | POA: Diagnosis not present

## 2016-09-28 DIAGNOSIS — R000 Tachycardia, unspecified: Secondary | ICD-10-CM

## 2016-09-28 DIAGNOSIS — R1011 Right upper quadrant pain: Secondary | ICD-10-CM

## 2016-09-28 DIAGNOSIS — R1031 Right lower quadrant pain: Secondary | ICD-10-CM | POA: Diagnosis present

## 2016-09-28 DIAGNOSIS — R300 Dysuria: Secondary | ICD-10-CM

## 2016-09-28 DIAGNOSIS — N10 Acute tubulo-interstitial nephritis: Secondary | ICD-10-CM | POA: Insufficient documentation

## 2016-09-28 LAB — POCT I-STAT, CHEM 8
BUN: 14 mg/dL (ref 6–20)
CHLORIDE: 102 mmol/L (ref 101–111)
Calcium, Ion: 1.16 mmol/L (ref 1.15–1.40)
Creatinine, Ser: 0.7 mg/dL (ref 0.44–1.00)
GLUCOSE: 106 mg/dL — AB (ref 65–99)
HCT: 36 % (ref 36.0–46.0)
Hemoglobin: 12.2 g/dL (ref 12.0–15.0)
POTASSIUM: 4.1 mmol/L (ref 3.5–5.1)
SODIUM: 135 mmol/L (ref 135–145)
TCO2: 23 mmol/L (ref 0–100)

## 2016-09-28 LAB — CBC
HEMATOCRIT: 35.3 % — AB (ref 36.0–46.0)
Hemoglobin: 11.6 g/dL — ABNORMAL LOW (ref 12.0–15.0)
MCH: 30 pg (ref 26.0–34.0)
MCHC: 32.9 g/dL (ref 30.0–36.0)
MCV: 91.2 fL (ref 78.0–100.0)
Platelets: 292 10*3/uL (ref 150–400)
RBC: 3.87 MIL/uL (ref 3.87–5.11)
RDW: 13.7 % (ref 11.5–15.5)
WBC: 12.1 10*3/uL — AB (ref 4.0–10.5)

## 2016-09-28 LAB — COMPREHENSIVE METABOLIC PANEL
ALT: 15 U/L (ref 14–54)
AST: 19 U/L (ref 15–41)
Albumin: 4 g/dL (ref 3.5–5.0)
Alkaline Phosphatase: 66 U/L (ref 38–126)
Anion gap: 9 (ref 5–15)
BILIRUBIN TOTAL: 0.8 mg/dL (ref 0.3–1.2)
BUN: 10 mg/dL (ref 6–20)
CALCIUM: 9.1 mg/dL (ref 8.9–10.3)
CO2: 23 mmol/L (ref 22–32)
CREATININE: 0.65 mg/dL (ref 0.44–1.00)
Chloride: 103 mmol/L (ref 101–111)
GFR calc Af Amer: >60 mL/min (ref >60)
Glucose, Bld: 102 mg/dL — ABNORMAL HIGH (ref 65–99)
Potassium: 3.8 mmol/L (ref 3.5–5.1)
Sodium: 135 mmol/L (ref 135–145)
TOTAL PROTEIN: 7.5 g/dL (ref 6.5–8.1)

## 2016-09-28 LAB — POCT URINALYSIS DIP (DEVICE)
BILIRUBIN URINE: NEGATIVE
Glucose, UA: NEGATIVE mg/dL
KETONES UR: NEGATIVE mg/dL
Leukocytes, UA: NEGATIVE
Nitrite: NEGATIVE
PH: 6.5 (ref 5.0–8.0)
Protein, ur: NEGATIVE mg/dL
SPECIFIC GRAVITY, URINE: 1.02 (ref 1.005–1.030)
Urobilinogen, UA: 0.2 mg/dL (ref 0.0–1.0)

## 2016-09-28 LAB — POCT PREGNANCY, URINE: PREG TEST UR: NEGATIVE

## 2016-09-28 MED ORDER — ACETAMINOPHEN 325 MG PO TABS
650.0000 mg | ORAL_TABLET | Freq: Once | ORAL | Status: AC
  Administered 2016-09-28: 650 mg via ORAL

## 2016-09-28 MED ORDER — ACETAMINOPHEN 325 MG PO TABS
ORAL_TABLET | ORAL | Status: AC
  Filled 2016-09-28: qty 2

## 2016-09-28 NOTE — ED Triage Notes (Signed)
Pt presents to ED from urgent care for assessment of general malaise and fever x 2 days with right flank pain intermittent, worsening with palpation. Pt c/o pain to the abdomen upon [facility].  Patient sent here because urine and blood work came back "normal".

## 2016-09-28 NOTE — ED Triage Notes (Signed)
body aches started on Friday 6/1.  Fever started Monday.  Yesterday was feeling better and today she is worse again.  Denies cough, cold , runny nose.  Painful urination

## 2016-09-28 NOTE — ED Notes (Signed)
Orthostatic    Laying   110/73     Pulse    111                         Sitting     120/77    Pulse   99                         Standing    111/76   Pulse  107

## 2016-09-28 NOTE — ED Provider Notes (Signed)
CSN: [id number]     Arrival date & time 09/28/16  1735 History   None    Chief Complaint  Patient presents with  . Fever   (Consider location/radiation/quality/duration/timing/severity/associated sxs/priot Treatment) Ill appearing female with a history of well controlled HTN. Here due to fever that started 5 days ago. Does have 5/10 dysuria. Denies urinary frequency but does admit to some urinary urgency. Was better yesterday however the fever returned today. Denies sore throat, chest pain.  States she feels mildly dizzy when she stands up.  Tell me she has a dry cough as well however this is not a prominent symptoms. No history of asthma or smoking. Is status post C-Section x 1.      Past Medical History:  Diagnosis Date  . Dizziness 06/01/2015  . Hypertension   . Weight loss 06/01/2015   Past Surgical History:  Procedure Laterality Date  . CARDIAC SURGERY    . CESAREAN SECTION     Family History  Problem Relation Age of Onset  . Hypertension Mother    Social History  Substance Use Topics  . Smoking status: Never Smoker  . Smokeless tobacco: Not on file  . Alcohol use No   OB History    No data available     Review of Systems  Constitutional: Positive for appetite change, fatigue and fever. Negative for activity change.  Respiratory: Negative for shortness of breath.   Cardiovascular: Negative for chest pain.  Gastrointestinal: Positive for abdominal pain. Negative for abdominal distention, anal bleeding, blood in stool, constipation, diarrhea, nausea, rectal pain and vomiting.  Genitourinary: Positive for difficulty urinating, dysuria, flank pain, hematuria and urgency. Negative for decreased urine volume, menstrual problem, pelvic pain, vaginal bleeding, vaginal discharge and vaginal pain.    Allergies  Patient has no known allergies.  Home Medications   Prior to Admission medications   Medication Sig Start Date End Date Taking? Authorizing Provider  losartan  (COZAAR) 50 MG tablet TAKE 1 TABLET BY MOUTH ONCE DAILY 02/02/16   Chilton Si, MD   Meds Ordered and Administered this Visit   Medications  acetaminophen (TYLENOL) tablet 650 mg (650 mg Oral Given 09/28/16 1835)    BP (!) 144/83 (BP Location: Right Arm)   Pulse (!) 116   Temp (!) 101.6 F (38.7 C) (Oral)   Resp 16   LMP 09/07/2016   SpO2 100%  No data found.   Physical Exam  Constitutional: She appears well-developed and well-nourished.  Cardiovascular: Regular rhythm, normal heart sounds and intact distal pulses.  Exam reveals no gallop and no friction rub.   No murmur heard. Pulmonary/Chest: No respiratory distress. She has no wheezes. She has no rales. She exhibits no tenderness.  Abdominal: Soft. Bowel sounds are normal. She exhibits no distension and no mass. There is tenderness. There is CVA tenderness (right) and positive Murphy's sign. There is no rebound, no guarding (right upper quadrant) and no tenderness at McBurney's point. No hernia.    Urgent Care Course     Procedures (including critical care time)  Labs Review Labs Reviewed  POCT URINALYSIS DIP (DEVICE) - Abnormal; Notable for the following:       Result Value   Hgb urine dipstick TRACE (*)    All other components within normal limits  POCT PREGNANCY, URINE    MDM   1. Febrile illness   2. Right upper quadrant abdominal pain    Case precepted with Dr. Dayton Scrape.  Given patient abdominal pain, vital sign, exam,  and lack of other findings advised that [facility] for further work up and evaluation.     Ofilia Neaslark, Michael L, PA-C 09/28/16 1925

## 2016-09-28 NOTE — Discharge Instructions (Signed)
[facility]

## 2016-09-29 ENCOUNTER — Emergency Department (HOSPITAL_COMMUNITY): Payer: 59

## 2016-09-29 ENCOUNTER — Emergency Department (HOSPITAL_COMMUNITY)
Admission: EM | Admit: 2016-09-29 | Discharge: 2016-09-29 | Disposition: A | Discharge status: Home / Self Care | Payer: 59 | Attending: Emergency Medicine | Admitting: Emergency Medicine

## 2016-09-29 DIAGNOSIS — N12 Tubulo-interstitial nephritis, not specified as acute or chronic: Secondary | ICD-10-CM

## 2016-09-29 LAB — URINALYSIS, ROUTINE W REFLEX MICROSCOPIC
Bilirubin Urine: NEGATIVE
Glucose, UA: NEGATIVE mg/dL
KETONES UR: NEGATIVE mg/dL
Nitrite: NEGATIVE
PROTEIN: NEGATIVE mg/dL
Specific Gravity, Urine: 1.005 (ref 1.005–1.030)
pH: 5 (ref 5.0–8.0)

## 2016-09-29 LAB — LIPASE, BLOOD: LIPASE: 24 U/L (ref 11–51)

## 2016-09-29 LAB — I-STAT CG4 LACTIC ACID, ED: Lactic Acid, Venous: 0.42 mmol/L — ABNORMAL LOW (ref 0.5–1.9)

## 2016-09-29 MED ORDER — IBUPROFEN 400 MG PO TABS
400.0000 mg | ORAL_TABLET | Freq: Once | ORAL | Status: AC
  Administered 2016-09-29: 400 mg via ORAL

## 2016-09-29 MED ORDER — CEPHALEXIN 500 MG PO CAPS: 500.0000 mg | ORAL_CAPSULE | Freq: Two times a day (BID) | ORAL | 0 refills | Status: DC

## 2016-09-29 MED ORDER — HYDROCODONE-ACETAMINOPHEN 5-325 MG PO TABS: 1.0000 | ORAL_TABLET | ORAL | 0 refills | Status: DC | PRN

## 2016-09-29 MED ORDER — ONDANSETRON HCL 4 MG PO TABS: 4.0000 mg | ORAL_TABLET | Freq: Four times a day (QID) | ORAL | 0 refills | Status: DC

## 2016-09-29 MED ORDER — IBUPROFEN 400 MG PO TABS
ORAL_TABLET | ORAL | Status: AC
  Filled 2016-09-29: qty 1

## 2016-09-29 MED ORDER — SODIUM CHLORIDE 0.9 % IV BOLUS (SEPSIS)
1000.0000 mL | Freq: Once | INTRAVENOUS | Status: AC
  Administered 2016-09-29: 1000 mL via INTRAVENOUS

## 2016-09-29 MED ORDER — IOPAMIDOL (ISOVUE-300) INJECTION 61%
INTRAVENOUS | Status: AC
Start: 2016-09-29 — End: 2016-09-29
  Administered 2016-09-29: 100 mL
  Filled 2016-09-29: qty 100

## 2016-09-29 MED ORDER — DEXTROSE 5 % IV SOLN
2.0000 g | Freq: Once | INTRAVENOUS | Status: AC
  Administered 2016-09-29: 2 g via INTRAVENOUS
  Filled 2016-09-29: qty 2

## 2016-09-29 NOTE — ED Provider Notes (Signed)
[facility]-EMERGENCY DEPT Provider Note   CSN: [id number] Arrival date & time: 09/28/16  1942   By signing my name below, I, Deanna Davis, attest that this documentation has been prepared under the direction and in the presence of Thelia Tanksley, Canary Brim, MD. Electronically signed, Deanna Davis, ED Scribe. 09/29/16. 4:14 AM.   History   Chief Complaint Chief Complaint  Patient presents with  . Fever  . Flank Pain   The history is provided by the patient and medical records. No language interpreter was used.    Deanna Davis is a 35 y.o. female presenting to the Emergency Department with chief complaint of intermittent R flank pains x 6 days. Since this, pt has began experiencing fevers x 3 days, generalized pains, chills, dry cough, diaphoresis, headache and dysuria. She describes moderate to severe abdominal pain worse with deep breaths and radiating into the mid back. Pt seen at [facility] for these symptoms yesterday with blood work performed at this time that returned NL results. Pt advised to report to [facility] ED for further imaging from this evaluation. Pt given 2 doses of tylenol prior to evaluation with mild relief to chills and fever. No other complaints at this time.  Past Medical History:  Diagnosis Date  . Dizziness 06/01/2015  . Hypertension   . Weight loss 06/01/2015    Patient Active Problem List   Diagnosis Date Noted  . Dizziness 06/01/2015  . Weight loss 06/01/2015  . Essential hypertension 12/15/2014  . ASD (atrial septal defect) 12/15/2014    Past Surgical History:  Procedure Laterality Date  . CARDIAC SURGERY    . CESAREAN SECTION      OB History    No data available       Home Medications    Prior to Admission medications   Medication Sig Start Date End Date Taking? Authorizing Provider  losartan (COZAAR) 50 MG tablet TAKE 1 TABLET BY MOUTH ONCE DAILY 02/02/16  Yes Chilton Si, MD  cephALEXin (KEFLEX) 500 MG capsule Take 1 capsule (500 mg total) by mouth 2  (two) times daily. 09/29/16   Gilda Crease, MD  HYDROcodone-acetaminophen (NORCO/VICODIN) 5-325 MG tablet Take 1 tablet by mouth every 4 (four) hours as needed for moderate pain. 09/29/16   Gilda Crease, MD  ondansetron (ZOFRAN) 4 MG tablet Take 1 tablet (4 mg total) by mouth every 6 (six) hours. 09/29/16   Gilda Crease, MD    Family History Family History  Problem Relation Age of Onset  . Hypertension Mother     Social History Social History  Substance Use Topics  . Smoking status: Never Smoker  . Smokeless tobacco: Never Used  . Alcohol use No     Allergies   Patient has no known allergies.   Review of Systems Review of Systems  Constitutional: Positive for chills, diaphoresis and fever.  Respiratory: Positive for cough.   Gastrointestinal: Negative for nausea and vomiting.  Genitourinary: Positive for dysuria and flank pain.  All other systems reviewed and are negative.    Physical Exam Updated Vital Signs BP 111/74 (BP Location: Right Arm)   Pulse 97   Temp (!) 101 F (38.3 C) (Oral)   Resp 18   LMP 09/07/2016   SpO2 98%   Physical Exam  Constitutional: She is oriented to person, place, and time. She appears well-developed and well-nourished. No distress.  HENT:  Head: Normocephalic and atraumatic.  Right Ear: Hearing normal.  Left Ear: Hearing normal.  Nose: Nose normal.  Mouth/Throat: Oropharynx is clear and moist and mucous membranes are normal.  Eyes: Conjunctivae and EOM are normal. Pupils are equal, round, and reactive to light.  Neck: Normal range of motion. Neck supple.  Cardiovascular: Regular rhythm, S1 normal and S2 normal.  Exam reveals no gallop and no friction rub.   No murmur heard. Pulmonary/Chest: Effort normal and breath sounds normal. No respiratory distress. She exhibits no tenderness.  Abdominal: Soft. Normal appearance and bowel sounds are normal. There is no hepatosplenomegaly. There is tenderness in the right  upper quadrant. There is no rebound, no guarding, no tenderness at McBurney's point and negative Murphy's sign. No hernia.  +/- right CVA tenderness  Musculoskeletal: Normal range of motion.  Neurological: She is alert and oriented to person, place, and time. She has normal strength. No cranial nerve deficit or sensory deficit. Coordination normal. GCS eye subscore is 4. GCS verbal subscore is 5. GCS motor subscore is 6.  Skin: Skin is warm, dry and intact. No rash noted. No cyanosis.  Psychiatric: She has a normal mood and affect. Her speech is normal and behavior is normal. Thought content normal.  Nursing note and vitals reviewed.    ED Treatments / Results  DIAGNOSTIC STUDIES: Oxygen Saturation is 98% on RA, NL by my interpretation.    COORDINATION OF CARE: 12:43 AM-Discussed next steps with pt. Pt verbalized understanding and is agreeable with the plan. Will order labs and imaging.   Labs (all labs ordered are listed, but only abnormal results are displayed) Labs Reviewed  CBC - Abnormal; Notable for the following:       Result Value   WBC 12.1 (*)    Hemoglobin 11.6 (*)    HCT 35.3 (*)    All other components within normal limits  COMPREHENSIVE METABOLIC PANEL - Abnormal; Notable for the following:    Glucose, Bld 102 (*)    All other components within normal limits  URINALYSIS, ROUTINE W REFLEX MICROSCOPIC - Abnormal; Notable for the following:    Hgb urine dipstick MODERATE (*)    Leukocytes, UA TRACE (*)    Bacteria, UA MANY (*)    Squamous Epithelial / LPF 0-5 (*)    All other components within normal limits  I-STAT CG4 LACTIC ACID, ED - Abnormal; Notable for the following:    Lactic Acid, Venous 0.42 (*)    All other components within normal limits  LIPASE, BLOOD    EKG  EKG Interpretation None       Radiology Dg Chest 2 View  Result Date: 09/29/2016 CLINICAL DATA:  Fever and dry cough EXAM: CHEST  2 VIEW COMPARISON:  None. FINDINGS: The heart size and  mediastinal contours are within normal limits. Endocardial closure device is noted. Both lungs are clear. The visualized skeletal structures are unremarkable. IMPRESSION: No active cardiopulmonary disease. Electronically Signed   By: Tollie Eth M.D.   On: 09/29/2016 02:18   Ct Abdomen Pelvis W Contrast  Result Date: 09/29/2016 CLINICAL DATA:  Right-sided abdominal pain.  Fever. EXAM: CT ABDOMEN AND PELVIS WITH CONTRAST TECHNIQUE: Multidetector CT imaging of the abdomen and pelvis was performed using the standard protocol following bolus administration of intravenous contrast. CONTRAST:  ISOVUE-300 IOPAMIDOL (ISOVUE-300) INJECTION 61% COMPARISON:  Right upper quadrant ultrasound earlier this day. FINDINGS: Lower chest: Minimal dependent atelectasis at the lung bases. No pleural fluid. Hepatobiliary: No focal liver abnormality is seen. No gallstones, gallbladder wall thickening, or biliary dilatation. Pancreas: No ductal dilatation or inflammation. Spleen: Normal in  size without focal abnormality. Adrenals/Urinary Tract: Heterogeneous enhancement of the right kidney with mild prominence of the right renal collecting system. No urolithiasis. No perirenal or intrarenal fluid collection. Homogeneous left renal enhancement. Urinary bladder is physiologically distended without definite wall thickening. Stomach/Bowel: Detailed bowel evaluation limited secondary to lack of enteric contrast and paucity of intra-abdominal fat. Normal appendix. Moderate stool burden proximally. No bowel inflammation or obstruction. Stomach is physiologically distended. Vascular/Lymphatic: No significant vascular findings are present. No enlarged abdominal or pelvic lymph nodes. Reproductive: Probable corpus luteal cyst in the left ovary. Uterus is retroverted. Right ovary is normal in size. Other: Minimal free fluid in the pelvis. No upper abdominal ascites. No free air. No intra-abdominal abscess. Musculoskeletal: There are no acute  or suspicious osseous abnormalities. Mild degenerative disc disease at L4-L5. Hemi transitional lumbosacral anatomy. IMPRESSION: Findings consistent with right pyelonephritis. Electronically Signed   By: Rubye OaksMelanie  Ehinger M.D.   On: 09/29/2016 03:50   [place] Abdomen Limited Ruq  Result Date: 09/29/2016 CLINICAL DATA:  Right upper quadrant pain and fever x5 days EXAM: ULTRASOUND ABDOMEN LIMITED RIGHT UPPER QUADRANT COMPARISON:  None. FINDINGS: Gallbladder: No gallstones or wall thickening visualized. No sonographic Murphy sign noted by sonographer. Common bile duct: Diameter: 2.9 mm and normal Liver: No focal lesion identified. Within normal limits in parenchymal echogenicity. No portal vein thrombosis. Antegrade color Doppler flow noted within the portal vein. IMPRESSION: Unremarkable right upper quadrant abdomen ultrasound. Electronically Signed   By: Tollie Ethavid  Kwon M.D.   On: 09/29/2016 02:16    Procedures Procedures (including critical care time)  Medications Ordered in ED Medications  cefTRIAXone (ROCEPHIN) 2 g in dextrose 5 % 50 mL IVPB (2 g Intravenous New Bag/Given 09/29/16 0407)  ibuprofen (ADVIL,MOTRIN) tablet 400 mg (400 mg Oral Given 09/29/16 0017)  sodium chloride 0.9 % bolus 1,000 mL (0 mLs Intravenous Stopped 09/29/16 0306)  iopamidol (ISOVUE-300) 61 % injection (100 mLs  Contrast Given 09/29/16 0329)     Initial Impression / Assessment and Plan / ED Course  I have reviewed the triage vital signs and the nursing notes.  Pertinent labs & imaging results that were available during my care of the patient were reviewed by me and considered in my medical decision making (see chart for details).     Patient presents to the emergency department with fever, chills, right-sided abdominal pain. She started having right-sided back pain several days ago which then improved and then started again today. She did have tenderness in the right upper quadrant as well as equivocal right CVA tenderness. She was  febrile at arrival. No evidence of acute sepsis. Suspected urinary tract infection with pyelonephritis based on history, however, urinalysis performed at urgent care was normal. This was repeated here and did show bacteria but no clear findings of infection. Patient underwent right upper quadrant ultrasound because of right upper quadrant tenderness, no acute pathology was noted. Patient therefore underwent CT scan to further evaluate for the possibility of appendicitis. CT scan shows heterogeneous enhancement of the right kidney with dilation of the collecting system consistent with pyelonephritis. This does fit clinically, patient treated with Rocephin and can be treated outpatient with analgesia and Keflex.  Final Clinical Impressions(s) / ED Diagnoses   Final diagnoses:  Pyelonephritis    New Prescriptions New Prescriptions   CEPHALEXIN (KEFLEX) 500 MG CAPSULE    Take 1 capsule (500 mg total) by mouth 2 (two) times daily.   HYDROCODONE-ACETAMINOPHEN (NORCO/VICODIN) 5-325 MG TABLET    Take 1 tablet  by mouth every 4 (four) hours as needed for moderate pain.   ONDANSETRON (ZOFRAN) 4 MG TABLET    Take 1 tablet (4 mg total) by mouth every 6 (six) hours.  I personally performed the services described in this documentation, which was scribed in my presence. The recorded information has been reviewed and is accurate.    Gilda Crease, MD 09/29/16 [phone]

## 2016-09-29 NOTE — ED Notes (Signed)
ED Provider at bedside. 

## 2016-09-29 NOTE — ED Notes (Signed)
Patient transported to CT scan . 

## 2016-09-29 NOTE — ED Notes (Signed)
This RN attempted Iv access twice without success. Another RN to try

## 2016-09-29 NOTE — ED Notes (Signed)
Pt returned from radiology.

## 2017-01-31 ENCOUNTER — Other Ambulatory Visit: Payer: Self-pay | Admitting: Cardiovascular Disease

## 2017-01-31 DIAGNOSIS — I10 Essential (primary) hypertension: Secondary | ICD-10-CM

## 2017-01-31 NOTE — Telephone Encounter (Signed)
Refill Request.  

## 2017-03-11 ENCOUNTER — Other Ambulatory Visit: Payer: Self-pay | Admitting: Cardiovascular Disease

## 2017-03-11 DIAGNOSIS — I10 Essential (primary) hypertension: Secondary | ICD-10-CM

## 2017-03-13 NOTE — Telephone Encounter (Signed)
Please review for refill, Thanks !  

## 2017-03-30 ENCOUNTER — Encounter: Payer: Self-pay | Admitting: Obstetrics and Gynecology

## 2017-03-30 ENCOUNTER — Other Ambulatory Visit: Payer: Self-pay

## 2017-03-30 ENCOUNTER — Ambulatory Visit (INDEPENDENT_AMBULATORY_CARE_PROVIDER_SITE_OTHER): Payer: 59 | Admitting: Obstetrics and Gynecology

## 2017-03-30 ENCOUNTER — Other Ambulatory Visit (HOSPITAL_COMMUNITY)
Admission: RE | Admit: 2017-03-30 | Discharge: 2017-03-30 | Disposition: A | Discharge status: Home / Self Care | Payer: 59 | Source: Ambulatory Visit | Attending: Obstetrics and Gynecology | Admitting: Obstetrics and Gynecology

## 2017-03-30 VITALS — BP 122/70 | HR 92 | Resp 14 | Ht 63.0 in | Wt 129.0 lb

## 2017-03-30 DIAGNOSIS — L290 Pruritus ani: Secondary | ICD-10-CM | POA: Diagnosis not present

## 2017-03-30 DIAGNOSIS — Z30011 Encounter for initial prescription of contraceptive pills: Secondary | ICD-10-CM

## 2017-03-30 DIAGNOSIS — Z01419 Encounter for gynecological examination (general) (routine) without abnormal findings: Secondary | ICD-10-CM

## 2017-03-30 DIAGNOSIS — Z124 Encounter for screening for malignant neoplasm of cervix: Secondary | ICD-10-CM | POA: Diagnosis not present

## 2017-03-30 DIAGNOSIS — N763 Subacute and chronic vulvitis: Secondary | ICD-10-CM | POA: Diagnosis not present

## 2017-03-30 MED ORDER — BETAMETHASONE VALERATE 0.1 % EX OINT: 1.0000 "application " | TOPICAL_OINTMENT | Freq: Two times a day (BID) | CUTANEOUS | 0 refills | Status: DC

## 2017-03-30 MED ORDER — NORETHINDRONE 0.35 MG PO TABS: 1.0000 | ORAL_TABLET | Freq: Every day | ORAL | 3 refills | Status: DC

## 2017-03-30 NOTE — Patient Instructions (Addendum)
EXERCISE AND DIET:  We recommended that you start or continue a regular exercise program for good health. Regular exercise means any activity that makes your heart beat faster and makes you sweat.  We recommend exercising at least 30 minutes per day at least 3 days a week, preferably 4 or 5.  We also recommend a diet low in fat and sugar.  Inactivity, poor dietary choices and obesity can cause diabetes, heart attack, stroke, and kidney damage, among others.    ALCOHOL AND SMOKING:  Women should limit their alcohol intake to no more than 7 drinks/beers/glasses of wine (combined, not each!) per week. Moderation of alcohol intake to this level decreases your risk of breast cancer and liver damage. And of course, no recreational drugs are part of a healthy lifestyle.  And absolutely no smoking or even second hand smoke. Most people know smoking can cause heart and lung diseases, but did you know it also contributes to weakening of your bones? Aging of your skin?  Yellowing of your teeth and nails?  CALCIUM AND VITAMIN D:  Adequate intake of calcium and Vitamin D are recommended.  The recommendations for exact amounts of these supplements seem to change often, but generally speaking 600 mg of calcium (either carbonate or citrate) and 800 units of Vitamin D per day seems prudent. Certain women may benefit from higher intake of Vitamin D.  If you are among these women, your doctor will have told you during your visit.    PAP SMEARS:  Pap smears, to check for cervical cancer or precancers,  have traditionally been done yearly, although recent scientific advances have shown that most women can have pap smears less often.  However, every woman still should have a physical exam from her gynecologist every year. It will include a breast check, inspection of the vulva and vagina to check for abnormal growths or skin changes, a visual exam of the cervix, and then an exam to evaluate the size and shape of the uterus and  ovaries.  And after 38 years of age, a rectal exam is indicated to check for rectal cancers. We will also provide age appropriate advice regarding health maintenance, like when you should have certain vaccines, screening for sexually transmitted diseases, bone density testing, colonoscopy, mammograms, etc.   MAMMOGRAMS:  All women over 40 years old should have a yearly mammogram. Many facilities now offer a "3D" mammogram, which may cost around $50 extra out of pocket. If possible,  we recommend you accept the option to have the 3D mammogram performed.  It both reduces the number of women who will be called back for extra views which then turn out to be normal, and it is better than the routine mammogram at detecting truly abnormal areas.    COLONOSCOPY:  Colonoscopy to screen for colon cancer is recommended for all women at age 50.  We know, you hate the idea of the prep.  We agree, BUT, having colon cancer and not knowing it is worse!!  Colon cancer so often starts as a polyp that can be seen and removed at colonscopy, which can quite literally save your life!  And if your first colonoscopy is normal and you have no family history of colon cancer, most women don't have to have it again for 10 years.  Once every ten years, you can do something that may end up saving your life, right?  We will be happy to help you get it scheduled when you are ready.    Be sure to check your insurance coverage so you understand how much it will cost.  It may be covered as a preventative service at no cost, but you should check your particular policy.     Norethindrone tablets (contraception) What is this medicine? NORETHINDRONE (nor eth IN drone) is an oral contraceptive. The product contains a female hormone known as a progestin. It is used to prevent pregnancy. This medicine may be used for other purposes; ask your health care provider or pharmacist if you have questions. COMMON BRAND NAME(S): Camila, Deblitane 28-Day,  Errin, Heather, [place], Jolivette, Donna, Nor-QD, Nora-BE, Norlyroc, Ortho Micronor, [employer] 28-Day What should I tell my health care provider before I take this medicine? They need to know if you have any of these conditions: -blood vessel disease or blood clots -breast, cervical, or vaginal cancer -diabetes -heart disease -kidney disease -liver disease -mental depression -migraine -seizures -stroke -vaginal bleeding -an unusual or allergic reaction to norethindrone, other medicines, foods, dyes, or preservatives -pregnant or trying to get pregnant -breast-feeding How should I use this medicine? Take this medicine by mouth with a glass of water. You may take it with or without food. Follow the directions on the prescription label. Take this medicine at the same time each day and in the order directed on the package. Do not take your medicine more often than directed. Contact your pediatrician regarding the use of this medicine in children. Special care may be needed. This medicine has been used in female children who have started having menstrual periods. A patient package insert for the product will be given with each prescription and refill. Read this sheet carefully each time. The sheet may change frequently. Overdosage: If you think you have taken too much of this medicine contact a poison control center or emergency room at once. NOTE: This medicine is only for you. Do not share this medicine with others. What if I miss a dose? Try not to miss a dose. Every time you miss a dose or take a dose late your chance of pregnancy increases. When 1 pill is missed (even if only 3 hours late), take the missed pill as soon as possible and continue taking a pill each day at the regular time (use a back up method of birth control for the next 48 hours). If more than 1 dose is missed, use an additional birth control method for the rest of your pill pack until menses occurs. Contact your health care  professional if more than 1 dose has been missed. What may interact with this medicine? Do not take this medicine with any of the following medications: -amprenavir or fosamprenavir -bosentan This medicine may also interact with the following medications: -antibiotics or medicines for infections, especially rifampin, rifabutin, rifapentine, and griseofulvin, and possibly penicillins or tetracyclines -aprepitant -barbiturate medicines, such as phenobarbital -carbamazepine -felbamate -modafinil -oxcarbazepine -phenytoin -ritonavir or other medicines for HIV infection or AIDS -St. John's wort -topiramate This list may not describe all possible interactions. Give your health care provider a list of all the medicines, herbs, non-prescription drugs, or dietary supplements you use. Also tell them if you smoke, drink alcohol, or use illegal drugs. Some items may interact with your medicine. What should I watch for while using this medicine? Visit your doctor or health care professional for regular checks on your progress. You will need a regular breast and pelvic exam and Pap smear while on this medicine. Use an additional method of birth control during the first cycle that  you take these tablets. If you have any reason to think you are pregnant, stop taking this medicine right away and contact your doctor or health care professional. If you are taking this medicine for hormone related problems, it may take several cycles of use to see improvement in your condition. This medicine does not protect you against HIV infection (AIDS) or any other sexually transmitted diseases. What side effects may I notice from receiving this medicine? Side effects that you should report to your doctor or health care professional as soon as possible: -breast tenderness or discharge -pain in the abdomen, chest, groin or leg -severe headache -skin rash, itching, or hives -sudden shortness of breath -unusually weak  or tired -vision or speech problems -yellowing of skin or eyes Side effects that usually do not require medical attention (report to your doctor or health care professional if they continue or are bothersome): -changes in sexual desire -change in menstrual flow -facial hair growth -fluid retention and swelling -headache -irritability -nausea -weight gain or loss This list may not describe all possible side effects. Call your doctor for medical advice about side effects. You may report side effects to FDA at 1-800-FDA-1088. Where should I keep my medicine? Keep out of the reach of children. Store at room temperature between 15 and 30 degrees C (59 and 86 degrees F). Throw away any unused medicine after the expiration date. NOTE: This sheet is a summary. It may not cover all possible information. If you have questions about this medicine, talk to your doctor, pharmacist, or health care provider.  2018 Elsevier/Gold Standard (2011-12-30 16:41:35)  Autoexamen de mamas (Breast Self-Awareness) Autoexaminarse las mamas significa familiarizarse con el aspecto y la sensacin de las mamas al tacto. Incluye revisarse las mamas habitualmente e informarle al mdico acerca de cualquier cambio. Es importante autoexaminarse las [place]mamas. Un cambio en las mamas puede ser un signo de un problema mdico grave. Familiarizarse con el aspecto de las mamas y con cmo se sienten al tacto Product/process development scientistle permite detectar los problemas rpidamente, cuando es ms probable que el tratamiento resulte eficaz. Todas las mujeres deben autoexaminarse las Baxtermamas, incluso aquellas que se sometieron a implantes mamarios. CMO REALIZAR EL AUTOEXAMEN DE MAMAS Una forma de aprender qu es normal para sus mamas y si sufren modificaciones es Radio producerhacer un autoexamen. Para hacer un autoexamen de las mamas: Busque cambios 1. Qutese toda la ropa por encima de la cintura. 2. Prese frente a un espejo en una habitacin con buena iluminacin. 3. Apoye  las manos en las caderas. 4. Empuje con fuerza hacia abajo con las manos. 5. Compare las mamas en el espejo. Busque diferencias entre ellas (asimetra), por ejemplo:  Diferencias en la forma.  Diferencias en el tamao.  Pliegues, depresiones y ndulos en Deborha Paymentuna mama, y no en la otra. 1. Observe cada mama para buscar cambios en la piel, por ejemplo:  Enrojecimiento.  Zonas escamosas. 1. Observe si hay cambios en los pezones, por ejemplo:  Secrecin.  Hemorragia.  Hoyuelos.  Enrojecimiento.  Un cambio en la posicin. Plpese para detectar si hay cambios Plpese las mamas con cuidado para detectar ndulos y [place]cambios. Lo mejor es hacerlo mientras est acostada boca arriba en el piso y nuevamente mientras est sentada o de pie en la ducha o la baera con agua jabonosa en la piel. Plpese cada mama de la siguiente forma:  Coloque el brazo del lado de la mama que se examina por arriba de la cabeza.  Plpese la mama con  la [place]aho[place]ra mano.  Comience en la zona del pezn y haga crculos superpuestos de de pulgada (2cm). Para hacerlo, use las yemas de los tres dedos del [place] Crist[place]medio. Ejerza una presin [place]suave, luego mediana y [place]luego firme. La presin Location managersuave le permitir palpar el tejido ms cercano a la piel. La presin mediana le permitir palpar el tejido que est un poco ms profundo. La presin Visual merchandiserfirme le permitir palpar el tejido ms cercano a las costillas.  Continuar superponiendo crculos y vaya hacia abajo, hasta sentir las Sholescostillas, por debajo del Terrace Heightspecho.  Desplcese a una distancia del ancho de un dedo hacia el centro del cuerpo. Siga con los crculos superpuestos de de pulgada (2cm) para palpar la mama, mientras asciende lentamente hacia la clavcula.  Contine con el examen hacia arriba y Grand Viewhacia abajo con las tres presiones, Civil Service fast streamerhasta llegar a Management consultantla axila. Anote sus hallazgos Anote qu es normal para cada mama y los cambios que encuentre. Debe llevar un registro escrito con los cambios o los  hallazgos normales que encuentre para cada seno. Si registra esta informacin, no tiene Primary school teacherque depender solo de la memoria para Designer, industrial/productrecordar el tamao, la sensibilidad o la ubicacin de los Melbournehallazgos. Anote en qu momento se encuentra del ciclo menstrual, si usted todava est menstruando. Si tiene dificultad para Corporate investment bankerpercibir diferencias en las mamas, no se desaliente. Con el tiempo, se familiarizar con las variaciones de las mamas y se sentir ms cmoda con Lobbyistel examen. CON QU FRECUENCIA DEBO EXAMINARME LAS MAMAS? Examnese las Huntsman Corporationmamas todos los meses. Si est amamantando, el mejor momento para examinarse las [address]rmamas es despus de Museum/gallery exhibitions officeramamantar o de usar un Regulatory affairs officersacaleches. Si menstra, el mejor momento para examinarse las Redwoodmamas es 5 a 7das despus de finalizado el perodo menstrual. Durante la Columbiavillemenstruacin, las mamas estn abultadas, y tal vez sea ms difcil percibir los [place] Shorescambios. CUNDO DEBO CONTACTAR A MI MDICO? Consulte al mdico si percibe lo siguiente:  Un cambio en la forma o el tamao de las [address] Drmamas o los pezones.  Un cambio en la piel de las mamas o los pezones, como la piel enrojecida o escamosa.  Una secrecin anormal en los pezones.  Un ndulo o una zona engrosada que no tena antes.  Dolor de Woodlynnemamas.  Cualquier cosa que la preocupe. Esta informacin no tiene Theme park managercomo fin reemplazar el consejo del mdico. Asegrese de hacerle al mdico cualquier pregunta que tenga. Document Released: 04/11/2005 Document Revised: 08/03/2015 Document Reviewed: 03/01/2015 Elsevier Interactive Patient Education  [employer]2018 Elsevier Inc.

## 2017-03-30 NOTE — Progress Notes (Signed)
36 y.o. G1P1001 MarriedCaucasianF here for annual exam. Patient c/o vaginal and rectal itching, intermittent for the last month, sometimes worse at night. It has been a little better lately. No vaginal discharge.   Sexually active, rhythm for contraception.  Period Cycle (Days): 28 Period Duration (Days): 6 days  Period Pattern: Regular Menstrual Flow: Heavy, Moderate Menstrual Control: Thin pad, Maxi pad Menstrual Control Change Freq (Hours): changes pads 3 times a day on heavy days  Dysmenorrhea: (!) Severe Dysmenorrhea Symptoms: Headache  Cramps aren't so bad. Headaches are more of the problems. She had pre ecalmpsia with her pregnancy 8 years ago, HTN ever since.  She doesn't have a primary MD, cardiologist manages her hypertension   Patient's last menstrual period was 03/25/2017.          Sexually active: Yes.    The current method of family planning is none.    Exercising: No.  The patient does not participate in regular exercise at present. Smoker:  no  Health Maintenance: Pap:  2013 WNL per patient  History of abnormal Pap:  no TDaP:  Unsure  Gardasil: no    reports that  has never smoked. she has never used smokeless tobacco. She reports that she does not drink alcohol or use drugs. Son is 8, [place]. She and her husband work for [employer] and [employer]. 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   Past Medical History:  Diagnosis Date  . Dizziness 06/01/2015  . Hypertension   . Weight loss 06/01/2015    Past Surgical History:  Procedure Laterality Date  . ATRIAL SEPTAL DEFECT(ASD) CLOSURE  03/2011  . CARDIAC SURGERY    . CESAREAN SECTION    Pre ecalmpsia at 37 weeks, arrest of dilation. Son was 7 lb 7 oz.   Current Outpatient Medications  Medication Sig Dispense Refill  . losartan (COZAAR) 50 MG tablet TAKE 1 TABLET BY MOUTH EVERY DAY 30 tablet 0   No current facility-administered medications for this visit.     Family History   Problem Relation Age of Onset  . Hypertension Mother   . Stroke Mother   . Heart attack Mother   . Hypertension Father     Review of Systems  Constitutional: Negative.   HENT: Negative.   Eyes: Negative.   Respiratory: Negative.   Cardiovascular: Negative.   Gastrointestinal: Negative.   Endocrine: Negative.   Genitourinary: Negative.        Vaginal itching Rectal itching   Musculoskeletal: Negative.   Skin: Negative.   Allergic/Immunologic: Negative.   Neurological: Negative.   Psychiatric/Behavioral: Negative.     Exam:   BP 122/70 (BP Location: Right Arm, Patient Position: Sitting, Cuff Size: Normal)   Pulse 92   Resp 14   Ht 5\' 3"  (1.6 m)   Wt 129 lb (58.5 kg)   LMP 03/25/2017   BMI 22.85 kg/m   Weight change: @WEIGHTCHANGE @ Height:   Height: 5\' 3"  (160 cm)  Ht Readings from Last 3 Encounters:  03/30/17 5\' 3"  (1.6 m)  06/01/15 5\' 4"  (1.626 m)  12/24/14 5' 3.5" (1.613 m)    General appearance: alert, cooperative and appears stated age Head: Normocephalic, without obvious abnormality, atraumatic Neck: no adenopathy, supple, symmetrical, trachea midline and thyroid normal to inspection and palpation Lungs: clear to auscultation bilaterally Cardiovascular: regular rate and rhythm Breasts: normal appearance, no masses or tenderness Abdomen: soft, non-tender; non distended,  no masses,  no organomegaly Extremities: extremities normal,  atraumatic, no cyanosis or edema Skin: Skin color, texture, turgor normal. No rashes or lesions Lymph nodes: Cervical, supraclavicular, and axillary nodes normal. No abnormal inguinal nodes palpated Neurologic: Grossly normal   Pelvic: External genitalia:  no lesions, minimal erythema              Urethra:  normal appearing urethra with no masses, tenderness or lesions              Bartholins and Skenes: normal                 Vagina: normal appearing vagina with normal color, on cycle moderate blood, no d/c              Cervix:  no lesions               Bimanual Exam:  Uterus:  normal size, contour, position, consistency, mobility, non-tender and retroverted              Adnexa: no mass, fullness, tenderness               Rectovaginal: Confirms               Anus:  normal sphincter tone, no lesions, minimal erythema, no whitening  Chaperone was present for exam.  A:  Well Woman with normal exam  Vulvitis, peri-anal pruritus  P:   Pap with hpv  Affirm  Pap with hpv  Discussed breast self exam  Will start the mini pill, information given on the mirena and kyleena IUD  Discussed vulvar skin care  Steroid ointment   Labs with Cardiologist

## 2017-04-01 LAB — CYTOLOGY - PAP
DIAGNOSIS: NEGATIVE
HPV: NOT DETECTED

## 2017-04-01 LAB — VAGINITIS/VAGINOSIS, DNA PROBE
CANDIDA SPECIES: NEGATIVE
GARDNERELLA VAGINALIS: NEGATIVE
Trichomonas vaginosis: NEGATIVE

## 2017-04-05 ENCOUNTER — Telehealth: Payer: Self-pay

## 2017-04-05 ENCOUNTER — Other Ambulatory Visit: Payer: Self-pay

## 2017-04-05 DIAGNOSIS — I10 Essential (primary) hypertension: Secondary | ICD-10-CM

## 2017-04-05 MED ORDER — LOSARTAN POTASSIUM 50 MG PO TABS: 50.0000 mg | ORAL_TABLET | Freq: Every day | ORAL | 0 refills | Status: DC

## 2017-04-05 NOTE — Telephone Encounter (Signed)
Error

## 2017-05-13 ENCOUNTER — Other Ambulatory Visit: Payer: Self-pay | Admitting: Cardiovascular Disease

## 2017-05-13 DIAGNOSIS — I10 Essential (primary) hypertension: Secondary | ICD-10-CM

## 2017-06-16 ENCOUNTER — Other Ambulatory Visit: Payer: Self-pay | Admitting: *Deleted

## 2017-06-16 DIAGNOSIS — I10 Essential (primary) hypertension: Secondary | ICD-10-CM

## 2017-06-16 MED ORDER — LOSARTAN POTASSIUM 50 MG PO TABS: 50.0000 mg | ORAL_TABLET | Freq: Every day | ORAL | 0 refills | Status: DC

## 2017-06-27 ENCOUNTER — Other Ambulatory Visit: Payer: Self-pay | Admitting: *Deleted

## 2017-06-27 DIAGNOSIS — I10 Essential (primary) hypertension: Secondary | ICD-10-CM

## 2017-06-27 MED ORDER — LOSARTAN POTASSIUM 50 MG PO TABS: 50.0000 mg | ORAL_TABLET | Freq: Every day | ORAL | 0 refills | Status: DC

## 2017-07-14 ENCOUNTER — Other Ambulatory Visit: Payer: Self-pay | Admitting: Cardiovascular Disease

## 2017-07-14 DIAGNOSIS — I10 Essential (primary) hypertension: Secondary | ICD-10-CM

## 2017-07-14 NOTE — Telephone Encounter (Signed)
Please review for refill, Thanks !  

## 2017-09-06 ENCOUNTER — Encounter: Payer: Self-pay | Admitting: Cardiovascular Disease

## 2017-09-08 ENCOUNTER — Encounter: Payer: Self-pay | Admitting: *Deleted

## 2017-09-08 ENCOUNTER — Encounter: Payer: Self-pay | Admitting: Cardiovascular Disease

## 2017-09-08 ENCOUNTER — Ambulatory Visit (INDEPENDENT_AMBULATORY_CARE_PROVIDER_SITE_OTHER): Payer: 59 | Admitting: Cardiovascular Disease

## 2017-09-08 VITALS — BP 112/67 | HR 87 | Ht 64.0 in | Wt 130.2 lb

## 2017-09-08 DIAGNOSIS — Z5181 Encounter for therapeutic drug level monitoring: Secondary | ICD-10-CM | POA: Diagnosis not present

## 2017-09-08 DIAGNOSIS — Z8774 Personal history of (corrected) congenital malformations of heart and circulatory system: Secondary | ICD-10-CM | POA: Diagnosis not present

## 2017-09-08 DIAGNOSIS — I10 Essential (primary) hypertension: Secondary | ICD-10-CM

## 2017-09-08 MED ORDER — AMOXICILLIN 500 MG PO CAPS: ORAL_CAPSULE | ORAL | 3 refills | Status: DC

## 2017-09-08 MED ORDER — LOSARTAN POTASSIUM 50 MG PO TABS: 50.0000 mg | ORAL_TABLET | Freq: Every day | ORAL | 3 refills | Status: DC

## 2017-09-08 NOTE — Progress Notes (Signed)
Cardiology Office Note   Date:  09/08/2017   ID:  Deanna Davis, DOB 02-Nov-1980, MRN [id number]  PCP:  Patient, No Pcp Per  Cardiologist:   Chilton Si, MD   Chief Complaint  Patient presents with  . Follow-up      History of Present Illness: Deanna Davis is a 37 y.o. female with hypertension and an ASD s/p repair here for follow up.  Deanna Davis had a secundum ASD repaired with a 30mm Amplatzer device on 03/25/10.  Since her last appointment she has been doing well.  She has no chest pain or shortness of breath.  She does not get much formal exercise but does walk a lot at work.  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  She has no lower extremity edema, no orthopnea, or PND.  She checks her blood pressure at home and it typically is in the 120s over 80s.  Very rarely her diastolic goes up to 90.  She tries to limit her salt intake but does have more on the weekends.  Overall her diet has been healthy.  She recently went for dental cleaning and was not able to proceed without cardiology clearance.   Past Medical History:  Diagnosis Date  . Dizziness 06/01/2015  . Hypertension   . Weight loss 06/01/2015    Past Surgical History:  Procedure Laterality Date  . ATRIAL SEPTAL DEFECT(ASD) CLOSURE  03/2011  . CARDIAC SURGERY    . CESAREAN SECTION       Current Outpatient Medications  Medication Sig Dispense Refill  . betamethasone valerate ointment (VALISONE) 0.1 % Apply 1 application topically 2 (two) times daily. 30 g 0  . losartan (COZAAR) 50 MG tablet Take 1 tablet (50 mg total) by mouth daily. 90 tablet 3  . amoxicillin (AMOXIL) 500 MG capsule 4 CAPS BY MOUTH 1 HOUR PRIOR TO DENTAL PROCEDURE 4 capsule 3   No current facility-administered medications for this visit.     Allergies:   Patient has no known allergies.    Social History:  The patient  reports that she has never smoked. She has never used smokeless tobacco. She reports that she does  not drink alcohol or use drugs.   Family History:  The patient's family history includes Heart attack in her mother; Hypertension in her father and mother; Stroke in her mother.    ROS:  Please see the history of present illness.   Otherwise, review of systems are positive for none.   All other systems are reviewed and negative.    PHYSICAL EXAM: VS:  BP 112/67   Pulse 87   Ht  (1.626 m)   Wt 130 lb 3.2 oz (59.1 kg)   BMI 22.35 kg/m  , BMI Body mass index is 22.35 kg/m. GENERAL:  Well appearing HEENT: Pupils equal round and reactive, fundi not visualized, oral mucosa unremarkable NECK:  No jugular venous distention, waveform within normal limits, carotid upstroke brisk and symmetric, no bruits LUNGS:  Clear to auscultation bilaterally HEART:  RRR.  PMI not displaced or sustained,S1 and S2 within normal limits, no S3, no S4, no clicks, no rubs, no murmurs ABD:  Flat, positive bowel sounds normal in frequency in pitch, no bruits, no rebound, no guarding, no midline pulsatile mass, no hepatomegaly, no splenomegaly EXT:  2 plus pulses throughout, no edema, no cyanosis no clubbing SKIN:  No rashes no nodules NEURO:  Cranial nerves II through XII grossly intact, motor  grossly intact throughout [facility]:  Cognitively intact, oriented to person place and time   EKG:  EKG is ordered today. The ekg ordered 06/01/15 demonstrates sinus rhythm.  Rate 76 bpm.  Incomplete RBBB.   09/08/17:  Sinus rhythm.  Rate 87 bpm.  Incomplete RBBB.   Echo 01/24/12: LVEF >55%.  Repaired secundum ASD (Amplatzer) without residual shunt.  Trivial PR, MR.  Mild TR.    Echo 06/15/15: Study Conclusions  - Left ventricle: The cavity size was normal. Wall thickness was   normal. Systolic function was normal. The estimated ejection   fraction was in the range of 60% to 65%. Wall motion was normal;   there were no regional wall motion abnormalities. - Atrial septum: No defect or patent foramen ovale was  identified.   A patch was present.  Recent Labs: 09/28/2016: ALT 15; BUN 10; Creatinine, Ser 0.65; Hemoglobin 11.6; Platelets 292; Potassium 3.8; Sodium 135    Lipid Panel No results found for: CHOL, TRIG, HDL, CHOLHDL, VLDL, LDLCALC, LDLDIRECT    Wt Readings from Last 3 Encounters:  09/08/17 130 lb 3.2 oz (59.1 kg)  03/30/17 129 lb (58.5 kg)  06/01/15 129 lb 12.8 oz (58.9 kg)      ASSESSMENT AND PLAN:  # ASD s/p 30mm Amplatzer closure device:  Deanna Davis had a device placed in 2011.  She will repeat her echo in 2022, which will be 5 years after her last echo.  She will get antibiotic prophylaxis prior to dental procedures.  # Hypertension:  Blood pressure well-controlled.  Continue losartan.  She will check with her pharmacy to ensure there are no recalls.  Check BMP.   Current medicines are reviewed at length with the patient today.  The patient does not have concerns regarding medicines.  The following changes have been made:  no change  Labs/ tests ordered today include:   Orders Placed This Encounter  Procedures  . Basic metabolic panel     Disposition:   FU with Deanna Davis C. Duke Salvia, MD, [facility] in 2 years.     Signed, Laquincy Eastridge C. Duke Salvia, MD, [facility]  09/08/2017 5:22 PM    [facility]

## 2017-09-08 NOTE — Patient Instructions (Addendum)
Medication Instructions:  TAKE AMOXIL 500 MG 4 CAPS 1 HOUR PRIOR TO DENTAL PROCEDURE  Labwork: BMET TODAY   Testing/Procedures: NONE  Follow-Up: Your physician wants you to follow-up in: 2 YEARS You will receive a reminder letter in the mail two months in advance. If you don't receive a letter, please call our office to schedule the follow-up appointment.  Any Other Special Instructions Will Be Listed Below (If Applicable).

## 2017-09-09 LAB — BASIC METABOLIC PANEL
BUN / CREAT RATIO: 26 — AB (ref 9–23)
BUN: 18 mg/dL (ref 6–20)
CO2: 21 mmol/L (ref 20–29)
CREATININE: 0.69 mg/dL (ref 0.57–1.00)
Calcium: 9.3 mg/dL (ref 8.7–10.2)
Chloride: 106 mmol/L (ref 96–106)
GFR calc Af Amer: 127 mL/min/{1.73_m2} (ref >59)
GFR, EST NON AFRICAN AMERICAN: 110 mL/min/{1.73_m2} (ref >59)
Glucose: 97 mg/dL (ref 65–99)
POTASSIUM: 4.5 mmol/L (ref 3.5–5.2)
SODIUM: 141 mmol/L (ref 134–144)

## 2017-11-15 ENCOUNTER — Encounter: Payer: Self-pay | Admitting: Cardiovascular Disease

## 2017-11-15 ENCOUNTER — Ambulatory Visit (INDEPENDENT_AMBULATORY_CARE_PROVIDER_SITE_OTHER): Payer: 59 | Admitting: Cardiovascular Disease

## 2017-11-15 VITALS — BP 134/84 | HR 93 | Ht 64.0 in | Wt 131.0 lb

## 2017-11-15 DIAGNOSIS — I10 Essential (primary) hypertension: Secondary | ICD-10-CM

## 2017-11-15 DIAGNOSIS — R079 Chest pain, unspecified: Secondary | ICD-10-CM

## 2017-11-15 DIAGNOSIS — Z8774 Personal history of (corrected) congenital malformations of heart and circulatory system: Secondary | ICD-10-CM

## 2017-11-15 DIAGNOSIS — R0789 Other chest pain: Secondary | ICD-10-CM | POA: Diagnosis not present

## 2017-11-15 HISTORY — DX: Other chest pain: R07.89

## 2017-11-15 MED ORDER — METOPROLOL TARTRATE 50 MG PO TABS: ORAL_TABLET | ORAL | 0 refills | Status: DC

## 2017-11-15 NOTE — Patient Instructions (Addendum)
Medication Instructions:  Your physician recommends that you continue on your current medications as directed. Please refer to the Current Medication list given to you today.  TAKE METOPROLOL 50 MG ONE HOUR PRIOR TO THE CT SCAN   Labwork: BMET/D-DIMER  Testing/Procedures: Your physician has requested that you have cardiac CT. Cardiac computed tomography (CT) is a painless test that uses an x-ray machine to take clear, detailed pictures of your heart. For further information please visit [email or web address]. Please follow instruction sheet as given. THE OFFICE WILL CALL YOU TO ARRANGE AFTER THIS HAS BEEN APPROVED BY YOUR INSURANCE IF YOU DO NOT HEAR FROM THE OFFICE IN 3 WEEKS CALL [phone]  Follow-Up: Your physician recommends that you schedule a follow-up appointment in: 3 MONTHS   Any Other Special Instructions Will Be Listed Below (If Applicable).  Please [facility] at xx:xx AM (30-45 minutes prior to test start time)  [facility] [address] [place], [place] [address] [phone]  Proceed to the [facility] Radiology Department (First Floor).  Please follow these instructions carefully (unless otherwise directed):  On the Night Before the Test: . Drink plenty of water. . Do not consume any caffeinated/decaffeinated beverages or chocolate 12 hours prior to your test. . Do not take any antihistamines 12 hours prior to your test. . If you take Metformin do not take 24 hours prior to test. . If the patient has contrast allergy: ? Patient will need a prescription for Prednisone and very clear instructions (as follows): 1. Prednisone 50 mg - take 13 hours prior to test 2. Take another Prednisone 50 mg 7 hours prior to test 3. Take another Prednisone 50 mg 1 hour prior to test 4. Take Benadryl 50 mg 1 hour prior to test . Patient must complete all four doses of above prophylactic medications. . Patient will  need a ride after test due to Benadryl.  On the Day of the Test: . Drink plenty of water. Do not drink any water within one hour of the test. . Do not eat any food 4 hours prior to the test. . You may take your regular medications prior to the test. . IF NOT ON A BETA BLOCKER - Take 50 mg of lopressor (metoprolol) one hour before the test.  After the Test: . Drink plenty of water. . After receiving IV contrast, you may experience a mild flushed feeling. This is normal. . On occasion, you may experience a mild rash up to 24 hours after the test. This is not dangerous. If this occurs, you can take Benadryl 25 mg and increase your fluid intake. . If you experience trouble breathing, this can be serious. If it is severe call [phone] IMMEDIATELY. If it is mild, please call our office. . If you take any of these medications: Glipizide/Metformin, Avandament, Glucavance, please do not take 48 hours after completing test.

## 2017-11-15 NOTE — Progress Notes (Signed)
Cardiology Office Note   Date:  11/15/2017   ID:  Deanna Davis, DOB 1981-01-15, MRN [id number]  PCP:  Patient, No Pcp Per  Cardiologist:   Chilton Si, MD   Chief Complaint  Patient presents with  . Chest Pain    tightness; pressure off and on      History of Present Illness: Deanna Davis is a 37 y.o. female with hypertension and an ASD s/p repair here for follow up.  Deanna Davis had a secundum ASD repaired with a 30mm Amplatzer device on 03/25/10.  Since her last appointment she has developed substernal chest tightness.  This started 3 weeks ago and is intermittent.  It occurs both at rest and which exertion.   There is no associated shortness of breath, nausea or diaphoresis.  She notices it on some days but not others.  Each episode lasts for 20-50 minutes and recurs later in the day.  She does not get much formal exercise but does walk a lot at work.  She is a Deanna Davis and has to walk around the lab frequently.  Two weeks ago she was at [employer] and had chest discomfort some days but not all days that she was in the park. She has no heart burn and denies any musculoskeletal pain.  She is anxious because her mother recently had a heart attack.  She hasn't experienced any lower extremity edema, orthopnea or PND.     Past Medical History:  Diagnosis Date  . Atypical chest pain 11/15/2017  . Dizziness 06/01/2015  . Hypertension   . Weight loss 06/01/2015    Past Surgical History:  Procedure Laterality Date  . ATRIAL SEPTAL DEFECT(ASD) CLOSURE  03/2011  . CARDIAC SURGERY    . CESAREAN SECTION       Current Outpatient Medications  Medication Sig Dispense Refill  . amoxicillin (AMOXIL) 500 MG capsule 4 CAPS BY MOUTH 1 HOUR PRIOR TO DENTAL PROCEDURE 4 capsule 3  . betamethasone valerate ointment (VALISONE) 0.1 % Apply 1 application topically 2 (two) times daily. 30 g 0  . losartan (COZAAR) 50 MG tablet Take 1 tablet (50 mg total) by mouth daily. 90 tablet 3  .  metoprolol tartrate (LOPRESSOR) 50 MG tablet 1 TABLET BY MOUTH 1 HOUR PRIOR TO CT 1 tablet 0   No current facility-administered medications for this visit.     Allergies:   Patient has no known allergies.    Social History:  The patient  reports that she has never smoked. She has never used smokeless tobacco. She reports that she does not drink alcohol or use drugs.   Family History:  The patient's family history includes Heart attack in her mother; Hypertension in her father and mother; Stroke in her mother.    ROS:  Please see the history of present illness.   Otherwise, review of systems are positive for none.   All other systems are reviewed and negative.    PHYSICAL EXAM: VS:  BP 134/84   Pulse 93   Ht 5\' 4"  (1.626 m)   Wt 131 lb (59.4 kg)   BMI 22.49 kg/m  , BMI Body mass index is 22.49 kg/m. GENERAL:  Well appearing HEENT: Pupils equal round and reactive, fundi not visualized, oral mucosa unremarkable NECK:  No jugular venous distention, waveform within normal limits, carotid upstroke brisk and symmetric, no bruits LUNGS:  Clear to auscultation bilaterally HEART:  RRR.  PMI not displaced or sustained,S1 and S2 within normal limits, no  S3, no S4, no clicks, no rubs, no murmurs ABD:  Flat, positive bowel sounds normal in frequency in pitch, no bruits, no rebound, no guarding, no midline pulsatile mass, no hepatomegaly, no splenomegaly EXT:  2 plus pulses throughout, no edema, no cyanosis no clubbing SKIN:  No rashes no nodules NEURO:  Cranial nerves II through XII grossly intact, motor grossly intact throughout PSYCH:  Cognitively intact, oriented to person place and time   EKG:  EKG is ordered today. The ekg ordered 06/01/15 demonstrates sinus rhythm.  Rate 76 bpm.  Incomplete RBBB.   09/08/17:  Sinus rhythm.  Rate 87 bpm.  Incomplete RBBB.  11/15/17: Sinus rhythm.  Rate 92 bpm.  Incomplete RBBB.   Echo 01/24/12: LVEF >55%.  Repaired secundum ASD (Amplatzer) without  residual shunt.  Trivial PR, MR.  Mild TR.    Echo 06/15/15: Study Conclusions  - Left ventricle: The cavity size was normal. Wall thickness was   normal. Systolic function was normal. The estimated ejection   fraction was in the range of 60% to 65%. Wall motion was normal;   there were no regional wall motion abnormalities. - Atrial septum: No defect or patent foramen ovale was identified.   A patch was present.  Recent Labs: 09/08/2017: BUN 18; Creatinine, Ser 0.69; Potassium 4.5; Sodium 141    Lipid Panel No results found for: CHOL, TRIG, HDL, CHOLHDL, VLDL, LDLCALC, LDLDIRECT    Wt Readings from Last 3 Encounters:  11/15/17 131 lb (59.4 kg)  09/08/17 130 lb 3.2 oz (59.1 kg)  03/30/17 129 lb (58.5 kg)      ASSESSMENT AND PLAN:  # Atypical chest pain: Symptoms do not seem consistent with ischemia.  We will get a coronary CT-A to evaluate her coronaries, especially given her mother's recent MI. This will also allow us to assess for migration of her ASD closure device.  Given her recent travel we will get a d-dimer, though there is no evidence of PE/DVT on exam.   # ASD s/p 30mm Amplatzer closure device:  Deanna Davis had a device placed in 2011.  She will repeat her echo in 2022, which will be 5 years after her last echo.  She will get antibiotic prophylaxis prior to dental procedures.  # Hypertension:  Blood pressure well-controlled.  Goal <140/90 given her low risk of ASCVD.  Continue losartan.  Recommended that she increase her exercise if CT-A is negative for ischemia.    Current medicines are reviewed at length with the patient today.  The patient does not have concerns regarding medicines.  The following changes have been made:  no change  Labs/ tests ordered today include:   Orders Placed This Encounter  Procedures  . CT CORONARY MORPH W/CTA COR W/SCORE W/CA W/CM &/OR WO/CM  . CT CORONARY FRACTIONAL FLOW RESERVE DATA PREP  . CT CORONARY FRACTIONAL FLOW RESERVE  FLUID ANALYSIS  . Basic metabolic panel  . D-Dimer, Quantitative     Disposition:   FU with Jameis Newsham C. Duke Salviaandolph, MD, [facility] in 3 months.     Signed, Kamesha Herne C. Duke Salviaandolph, MD, [facility]  11/15/2017 4:28 PM    [facility]

## 2017-11-15 NOTE — Addendum Note (Signed)
Addended by: Regis BillPRATT, Ary Lavine B on: 11/15/2017 06:00 PM   Modules accepted: Orders

## 2017-11-16 ENCOUNTER — Telehealth: Payer: Self-pay | Admitting: *Deleted

## 2017-11-16 ENCOUNTER — Other Ambulatory Visit: Payer: Self-pay | Admitting: *Deleted

## 2017-11-16 DIAGNOSIS — R7989 Other specified abnormal findings of blood chemistry: Secondary | ICD-10-CM

## 2017-11-16 DIAGNOSIS — R079 Chest pain, unspecified: Secondary | ICD-10-CM

## 2017-11-16 LAB — BASIC METABOLIC PANEL
BUN/Creatinine Ratio: 24 — ABNORMAL HIGH (ref 9–23)
BUN: 19 mg/dL (ref 6–20)
CHLORIDE: 109 mmol/L — AB (ref 96–106)
CO2: 22 mmol/L (ref 20–29)
Calcium: 9.2 mg/dL (ref 8.7–10.2)
Creatinine, Ser: 0.78 mg/dL (ref 0.57–1.00)
GFR calc Af Amer: 111 mL/min/{1.73_m2} (ref >59)
GFR calc non Af Amer: 96 mL/min/{1.73_m2} (ref >59)
GLUCOSE: 88 mg/dL (ref 65–99)
POTASSIUM: 4.6 mmol/L (ref 3.5–5.2)
SODIUM: 143 mmol/L (ref 134–144)

## 2017-11-16 LAB — D-DIMER, QUANTITATIVE: D-DIMER: 0.68 mg/L FEU — ABNORMAL HIGH (ref 0.00–0.49)

## 2017-11-16 NOTE — Telephone Encounter (Signed)
Discussed with Dr Duke Salviaandolph and will order VQscan and chest xray Advised patient, orders placed, and [facility]

## 2017-11-17 ENCOUNTER — Ambulatory Visit (HOSPITAL_COMMUNITY)
Admission: RE | Admit: 2017-11-17 | Discharge: 2017-11-17 | Disposition: A | Discharge status: Home / Self Care | Payer: 59 | Source: Ambulatory Visit | Attending: Cardiovascular Disease | Admitting: Cardiovascular Disease

## 2017-11-17 ENCOUNTER — Telehealth: Payer: Self-pay | Admitting: Cardiovascular Disease

## 2017-11-17 DIAGNOSIS — R079 Chest pain, unspecified: Secondary | ICD-10-CM | POA: Insufficient documentation

## 2017-11-17 DIAGNOSIS — R7989 Other specified abnormal findings of blood chemistry: Secondary | ICD-10-CM | POA: Diagnosis present

## 2017-11-17 MED ORDER — TECHNETIUM TC 99M DIETHYLENETRIAME-PENTAACETIC ACID
32.0000 | Freq: Once | INTRAVENOUS | Status: AC
  Administered 2017-11-17: 32 via RESPIRATORY_TRACT

## 2017-11-17 MED ORDER — TECHNETIUM TO 99M ALBUMIN AGGREGATED
4.1000 | Freq: Once | INTRAVENOUS | Status: AC
  Administered 2017-11-17: 4.1 via INTRAVENOUS

## 2017-11-17 NOTE — Telephone Encounter (Signed)
New Message   Patient husband is calling to see about the pulmonary test results she had today/ Please call.

## 2017-11-17 NOTE — Telephone Encounter (Signed)
Called patient and advised that the results had not been read yet. I did send a text to patient with the information for mychart so they can be released there when they were read.

## 2017-11-18 ENCOUNTER — Other Ambulatory Visit: Payer: Self-pay

## 2017-11-18 ENCOUNTER — Encounter (HOSPITAL_COMMUNITY): Payer: Self-pay | Admitting: Emergency Medicine

## 2017-11-18 ENCOUNTER — Emergency Department (HOSPITAL_COMMUNITY)
Admission: EM | Admit: 2017-11-18 | Discharge: 2017-11-19 | Disposition: A | Discharge status: Home / Self Care | Payer: 59 | Attending: Emergency Medicine | Admitting: Emergency Medicine

## 2017-11-18 DIAGNOSIS — R0789 Other chest pain: Secondary | ICD-10-CM | POA: Insufficient documentation

## 2017-11-18 DIAGNOSIS — I10 Essential (primary) hypertension: Secondary | ICD-10-CM | POA: Diagnosis not present

## 2017-11-18 DIAGNOSIS — Z79899 Other long term (current) drug therapy: Secondary | ICD-10-CM | POA: Diagnosis not present

## 2017-11-18 DIAGNOSIS — Z8249 Family history of ischemic heart disease and other diseases of the circulatory system: Secondary | ICD-10-CM | POA: Insufficient documentation

## 2017-11-18 NOTE — ED Triage Notes (Addendum)
Pt has had chest pain/discomfort for 2 weeks. Seen by cards on Wednesday. On Thursday they were called with a positive D Dimer result.  [facility]  Were awaiting results but today she has not been feeling well and discomfort in her chest has intensified and a "warm" feeling.

## 2017-11-19 LAB — CBC
HEMATOCRIT: 38.6 % (ref 36.0–46.0)
Hemoglobin: 12.5 g/dL (ref 12.0–15.0)
MCH: 30 pg (ref 26.0–34.0)
MCHC: 32.4 g/dL (ref 30.0–36.0)
MCV: 92.8 fL (ref 78.0–100.0)
PLATELETS: 286 10*3/uL (ref 150–400)
RBC: 4.16 MIL/uL (ref 3.87–5.11)
RDW: 13 % (ref 11.5–15.5)
WBC: 4.9 10*3/uL (ref 4.0–10.5)

## 2017-11-19 LAB — I-STAT TROPONIN, ED: Troponin i, poc: 0.06 ng/mL (ref 0.00–0.08)

## 2017-11-19 LAB — BASIC METABOLIC PANEL
Anion gap: 6 (ref 5–15)
BUN: 20 mg/dL (ref 6–20)
CHLORIDE: 105 mmol/L (ref 98–111)
CO2: 26 mmol/L (ref 22–32)
Calcium: 8.9 mg/dL (ref 8.9–10.3)
Creatinine, Ser: 0.67 mg/dL (ref 0.44–1.00)
GFR calc Af Amer: >60 mL/min (ref >60)
GFR calc non Af Amer: >60 mL/min (ref >60)
GLUCOSE: 103 mg/dL — AB (ref 70–99)
Potassium: 3.8 mmol/L (ref 3.5–5.1)
SODIUM: 137 mmol/L (ref 135–145)

## 2017-11-19 LAB — I-STAT BETA HCG BLOOD, ED (MC, WL, AP ONLY): I-stat hCG, quantitative: <5 m[IU]/mL (ref <5)

## 2017-11-19 NOTE — ED Notes (Signed)
Pt did not respond when called for vitals  

## 2017-11-19 NOTE — ED Notes (Signed)
Pt did not answer when called

## 2017-11-19 NOTE — ED Provider Notes (Signed)
[facility] EMERGENCY DEPARTMENT Provider Note   CSN: [id number] Arrival date & time: 11/18/17  2256     History   Chief Complaint Chief Complaint  Patient presents with  . Chest Pain    HPI Deanna Davis is a 37 y.o. female.  The history is provided by the patient, the spouse and medical records. No language interpreter was used.     37 year old female with history of hypertension and recurrent dizziness presenting for evaluation of chest pain.  Patient mention 3 weeks ago she developed chest tightness and discomfort that concerns her.  She was seen by her cardiologist this past week for her symptoms.  During her evaluation, her d-dimer was elevated and she was scheduled to have a V/Q study.  She had a V/Q study 2 days ago and she is awaits results tomorrow from the cardiologist.  Burgess Estelle, she felt some discomfort in her chest while resting and watching a movie.  She described as a flushing burning sensation throughout her chest waxing and waning without associate exertional chest pain, shortness of breath diaphoresis or nausea. Pt was concerned because she has not had the results of the test yet.  Patient decided to come to the ER for further evaluation.  Currently patient reports she has no active symptoms.  She denies fever, chills, lightheadedness, dizziness, active chest pain, trouble breathing, productive cough, back pain, abdominal pain, nausea vomiting diarrhea or diaphoresis.  She is not a smoker.  Per record, patient has an ASD s/p 30mm Amplatzer closure device in 2011.  Report family history of cardiac disease.  Past Medical History:  Diagnosis Date  . Atypical chest pain 11/15/2017  . Dizziness 06/01/2015  . Hypertension   . Weight loss 06/01/2015    Patient Active Problem List   Diagnosis Date Noted  . Atypical chest pain 11/15/2017  . Dizziness 06/01/2015  . Weight loss 06/01/2015  . Essential hypertension 12/15/2014  . ASD (atrial septal defect)  12/15/2014    Past Surgical History:  Procedure Laterality Date  . ATRIAL SEPTAL DEFECT(ASD) CLOSURE  03/2011  . CARDIAC SURGERY    . CESAREAN SECTION       OB History    Gravida  1   Para  1   Term  1   Preterm      AB      Living  1     SAB      TAB      Ectopic      Multiple      Live Births  1            Home Medications    Prior to Admission medications   Medication Sig Start Date End Date Taking? Authorizing Provider  amoxicillin (AMOXIL) 500 MG capsule 4 CAPS BY MOUTH 1 HOUR PRIOR TO DENTAL PROCEDURE 09/08/17   Chilton Si, MD  betamethasone valerate ointment (VALISONE) 0.1 % Apply 1 application topically 2 (two) times daily. 03/30/17   Romualdo Bolk, MD  losartan (COZAAR) 50 MG tablet Take 1 tablet (50 mg total) by mouth daily. 09/08/17   Chilton Si, MD  metoprolol tartrate (LOPRESSOR) 50 MG tablet 1 TABLET BY MOUTH 1 HOUR PRIOR TO CT 11/15/17   Chilton Si, MD    Family History Family History  Problem Relation Age of Onset  . Hypertension Mother   . Stroke Mother   . Heart attack Mother   . Hypertension Father     Social History Social History  Tobacco Use  . Smoking status: Never Smoker  . Smokeless tobacco: Never Used  Substance Use Topics  . Alcohol use: No    Alcohol/week: 0.0 oz  . Drug use: No     Allergies   Patient has no known allergies.   Review of Systems Review of Systems  All other systems reviewed and are negative.    Physical Exam Updated Vital Signs BP 115/83 (BP Location: Right Arm)   Pulse 71   Temp 97.8 F (36.6 C)   Resp 17   LMP 10/30/2017   SpO2 100%   Physical Exam  Constitutional: She appears well-developed and well-nourished. No distress.  HENT:  Head: Atraumatic.  Eyes: Conjunctivae are normal.  Neck: Neck supple.  Cardiovascular: Normal rate, regular rhythm and normal pulses.  Pulmonary/Chest: Effort normal and breath sounds normal. She has no decreased breath  sounds. She has no wheezes. She has no rhonchi. She has no rales.  Abdominal: Soft. There is no tenderness.  Musculoskeletal:       Right lower leg: She exhibits no edema.       Left lower leg: She exhibits no edema.  Neurological: She is alert.  Skin: No rash noted.  Psychiatric: She has a normal mood and affect.  Nursing note and vitals reviewed.    ED Treatments / Results  Labs (all labs ordered are listed, but only abnormal results are displayed) Labs Reviewed  BASIC METABOLIC PANEL - Abnormal; Notable for the following components:      Result Value   Glucose, Bld 103 (*)    All other components within normal limits  CBC  I-STAT TROPONIN, ED  I-STAT BETA HCG BLOOD, ED ([facility], [facility], [facility] ONLY)    EKG None   Date: 11/19/2017  Rate: 68  Rhythm: normal sinus rhythm  QRS Axis: normal  Intervals: normal  ST/T Wave abnormalities: normal  Conduction Disutrbances: incomplete RBBB  Narrative Interpretation:   Old EKG Reviewed: No significant changes noted     Radiology Dg Chest 2 View  Result Date: 11/17/2017 CLINICAL DATA:  Shortness of breath and chest tightness beginning last night. Elevated D-dimer. EXAM: CHEST - 2 VIEW COMPARISON:  PA and lateral chest 09/29/2016. FINDINGS: The lungs are clear. Mild peribronchial thickening is seen. No pneumothorax or pleural effusion. Closure device for an atrial septal defect is noted. No bony abnormality. IMPRESSION: Mild peribronchial thickening suggestive of bronchitis. The study is otherwise negative. Electronically Signed   By: Drusilla Kannerhomas  Dalessio M.D.   On: 11/17/2017 14:29   Nm Pulmonary Perf And Vent  Result Date: 11/17/2017 CLINICAL DATA:  Chest tightness EXAM: NUCLEAR MEDICINE VENTILATION - PERFUSION LUNG SCAN VIEWS: Anterior, posterior, left lateral, right lateral, RPO, LPO, RAO, LAO-ventilation and perfusion RADIOPHARMACEUTICALS:  32.0 mCi of Tc-2340m DTPA aerosol inhalation and 4.1 mCi Tc7640m-MAA IV COMPARISON:  Chest radiograph November 17, 2017 FINDINGS: Ventilation: Radiotracer uptake is homogeneous and symmetric bilaterally. No ventilation defects are evident. Perfusion: Radiotracer uptake is homogeneous and symmetric bilaterally. No perfusion defects are evident. IMPRESSION: No appreciable ventilation or perfusion defects. This study constitutes a very low probability of pulmonary embolus. Electronically Signed   By: Bretta BangWilliam  Woodruff III M.D.   On: 11/17/2017 14:38    Procedures Procedures (including critical care time)  Medications Ordered in ED Medications - No data to display   Initial Impression / Assessment and Plan / ED Course  I have reviewed the triage vital signs and the nursing notes.  Pertinent labs & imaging results that were  available during my care of the patient were reviewed by me and considered in my medical decision making (see chart for details).     BP 115/83 (BP Location: Right Arm)   Pulse 71   Temp 97.8 F (36.6 C)   Resp 17   LMP 10/30/2017   SpO2 100%    Final Clinical Impressions(s) / ED Diagnoses   Final diagnoses:  Atypical chest pain    ED Discharge Orders    None     6:20 AM Patient here with intermittent atypical chest discomfort for the past several weeks.  She had a VQ scan 2 days prior after being seen by cardiology and had an elevated d-dimer.  Her VQ scan result today shows very low probability for PE.  Patient is reassured.  She does not have any active symptoms at this time.  Her EKGs, troponin, and labs are reassuring currently.  She is stable for discharge.  She will follow-up with cardiology as previously scheduled for further testing which may include a coronary CT scan per prior record.  I have spent time reviewing and interpreted labs and EKG results and discussed these finding with patient.  Return precautions discussed.   Fayrene Helper, PA-C 11/19/17 1610    Derwood Kaplan, MD 11/19/17 [phone]

## 2017-11-19 NOTE — Discharge Instructions (Addendum)
Please follow up with your cardiologist as previously scheduled.  You do not have a blood clot in your lung.

## 2017-11-20 ENCOUNTER — Telehealth: Payer: Self-pay | Admitting: Cardiovascular Disease

## 2017-11-20 MED ORDER — AZITHROMYCIN 250 MG PO TABS: ORAL_TABLET | ORAL | 0 refills | Status: DC

## 2017-11-20 NOTE — Telephone Encounter (Signed)
Advised patient of results and Rx sent to [facility]

## 2017-11-20 NOTE — Telephone Encounter (Signed)
Pt's spouse is calling and want to know results of pt's test she had done. Please advise.

## 2017-11-20 NOTE — Telephone Encounter (Signed)
-----   Message from Chilton Siiffany , MD sent at 11/20/2017 12:00 PM EDT ----- No PE but it looks like she may have bronchitis.  Please order a Z pack.

## 2018-01-08 ENCOUNTER — Telehealth: Payer: Self-pay | Admitting: *Deleted

## 2018-01-08 DIAGNOSIS — Z01812 Encounter for preprocedural laboratory examination: Secondary | ICD-10-CM

## 2018-01-08 DIAGNOSIS — R079 Chest pain, unspecified: Secondary | ICD-10-CM

## 2018-01-08 NOTE — Telephone Encounter (Signed)
[facility], she is back in town and would like to proceed with scheduling her Cardiac CT. Current order placed in Epic

## 2018-02-16 ENCOUNTER — Ambulatory Visit (HOSPITAL_COMMUNITY): Payer: 59

## 2018-02-16 ENCOUNTER — Ambulatory Visit (HOSPITAL_COMMUNITY)
Admission: RE | Admit: 2018-02-16 | Discharge: 2018-02-16 | Disposition: A | Discharge status: Home / Self Care | Payer: 59 | Source: Ambulatory Visit | Attending: Cardiovascular Disease | Admitting: Cardiovascular Disease

## 2018-02-16 DIAGNOSIS — R079 Chest pain, unspecified: Secondary | ICD-10-CM | POA: Diagnosis not present

## 2018-02-16 DIAGNOSIS — R0789 Other chest pain: Secondary | ICD-10-CM | POA: Diagnosis not present

## 2018-02-16 IMAGING — CT CT HEART MORP W/ CTA COR W/ SCORE W/ CA W/CM &/OR W/O CM
2 of 3 series · 14 of 20 positions shown, 16 images · non-contrast
Comparison: None.

HISTORY: Atypical chest pain; status post ASD closure device

EXAM:
Cardiac/Coronary  CT
TECHNIQUE: The patient was scanned on a Siemens Force scanner.
PROTOCOL: A 120 kV prospective scan was triggered in the descending thoracic
aorta at 111 HU's. Axial non-contrast 3 mm slices were carried out
through the heart. The data set was analyzed on a dedicated work
station and scored using the Agatson method. Gantry rotation speed
was 250 msecs and collimation was .6 mm. No beta blockade and 0.8 mg
of sl NTG was given. The 3D data set was reconstructed in 5%
intervals of the 67-82 % of the R-R cycle. Diastolic phases were
analyzed on a dedicated work station using MPR, MIP and VRT modes.
The patient received 80 cc of contrast.

[Series 6: best diast 73 % · axial · 0.39mm/px · z∈[+990,+1093]mm · 7 of 343 slices shown, 9 images]
[im 43/343  vessel]
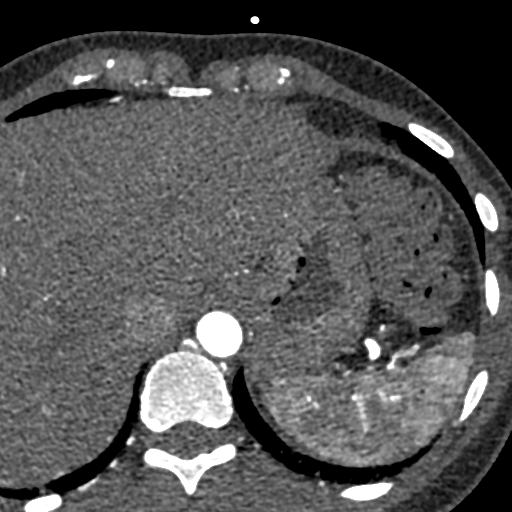
[im 43/343  lung]
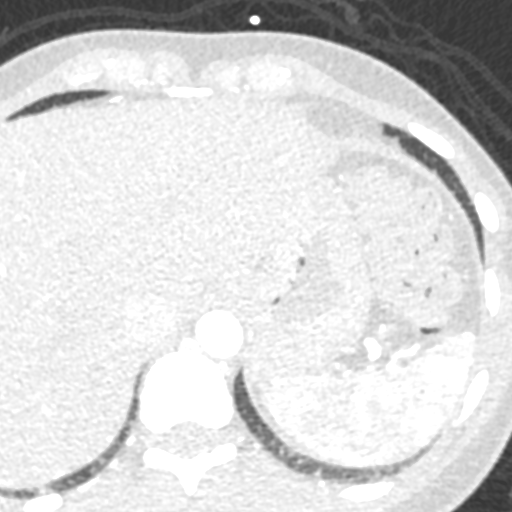
[im 86/343  vessel]
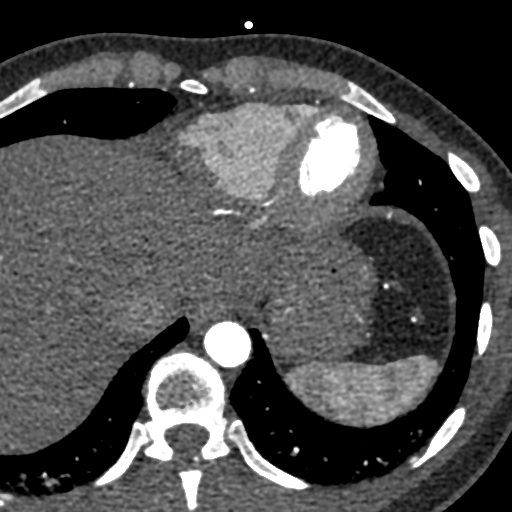
[im 129/343  vessel]
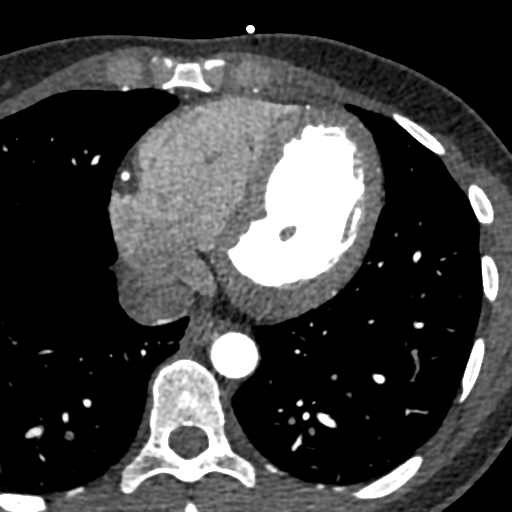
[im 172/343  vessel]
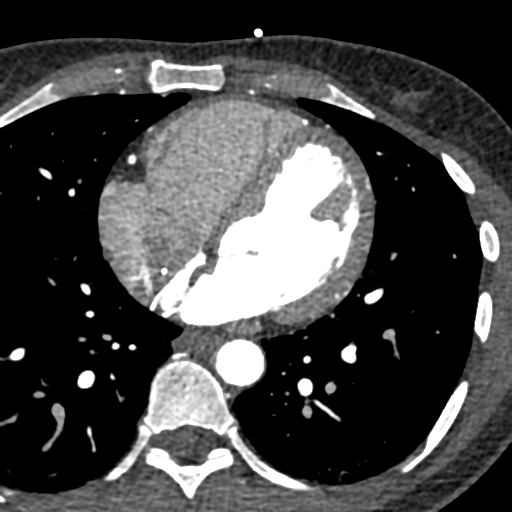
[im 214/343  vessel]
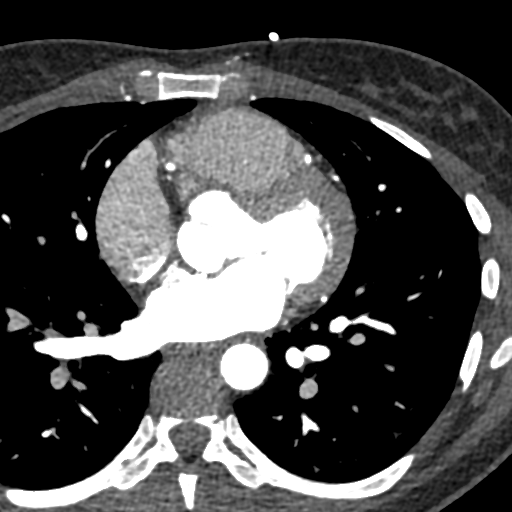
[im 214/343  lung]
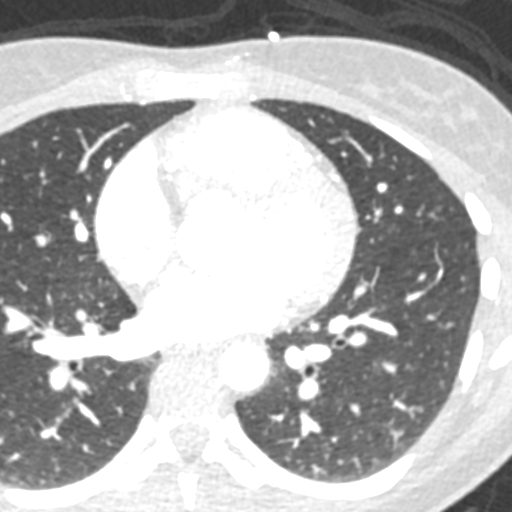
[im 257/343  vessel]
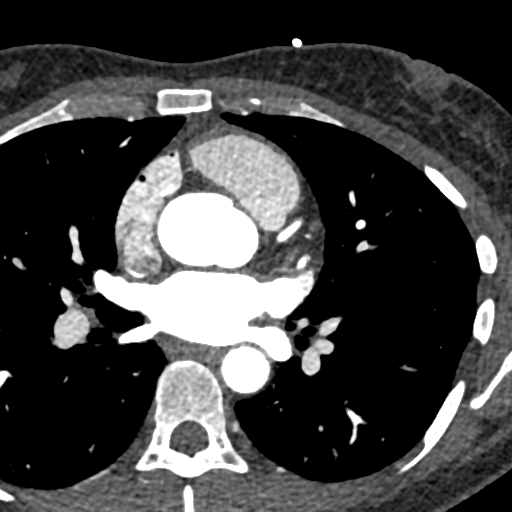
[im 300/343  vessel]
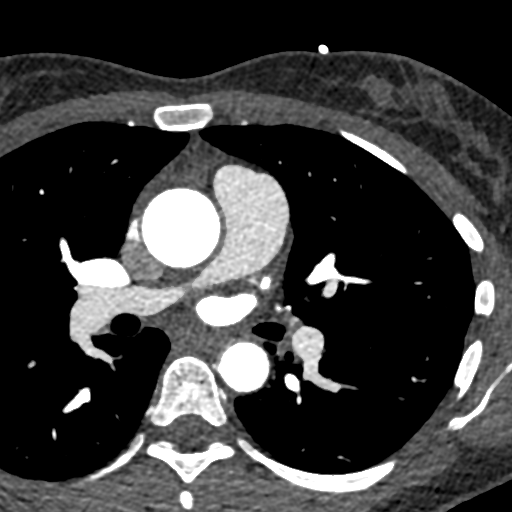

[Series 7: best syst 33 % · axial · 0.39mm/px · z∈[+990,+1093]mm · 7 of 343 slices shown]
[im 43/343  vessel]
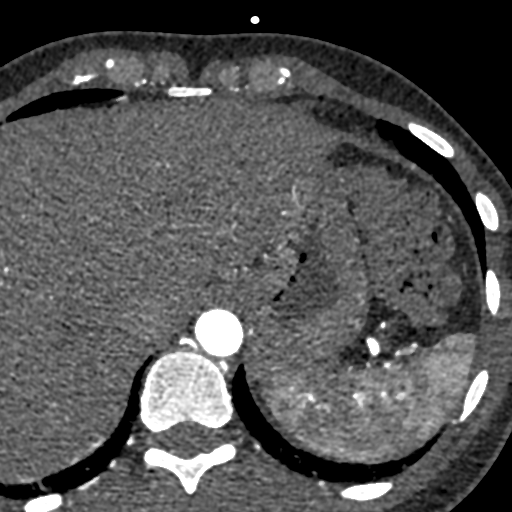
[im 86/343  vessel]
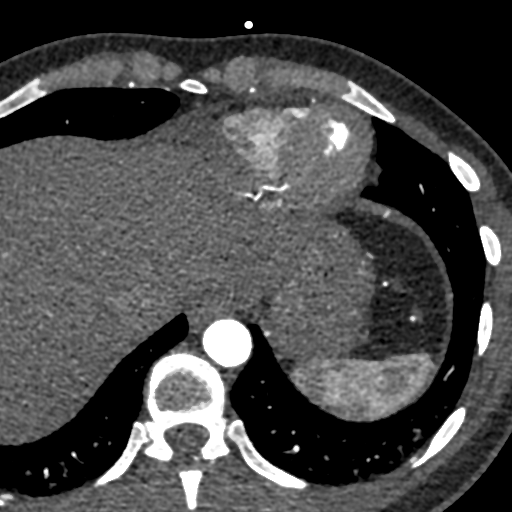
[im 129/343  vessel]
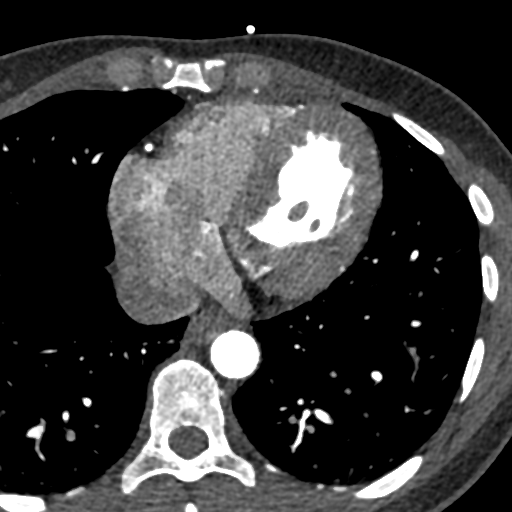
[im 172/343  vessel]
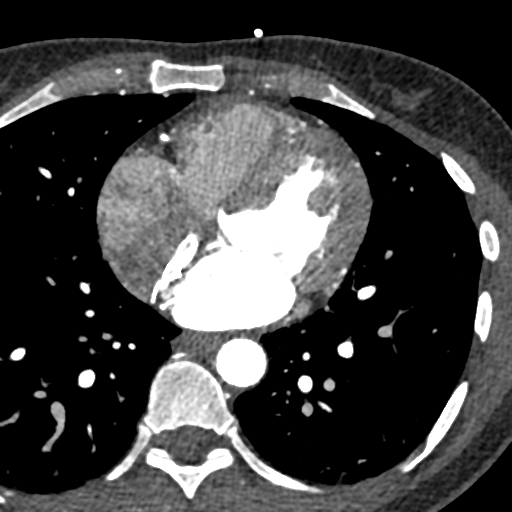
[im 214/343  vessel]
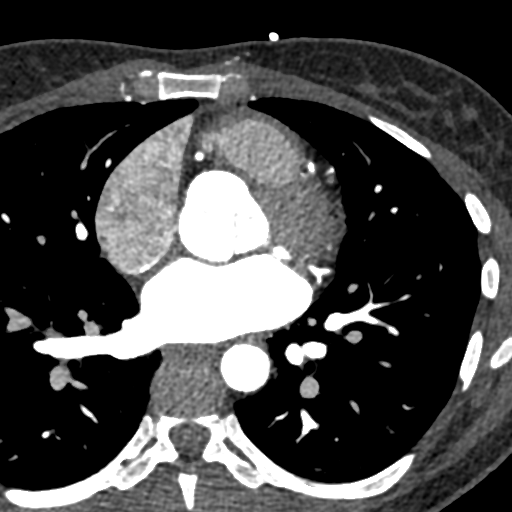
[im 257/343  vessel]
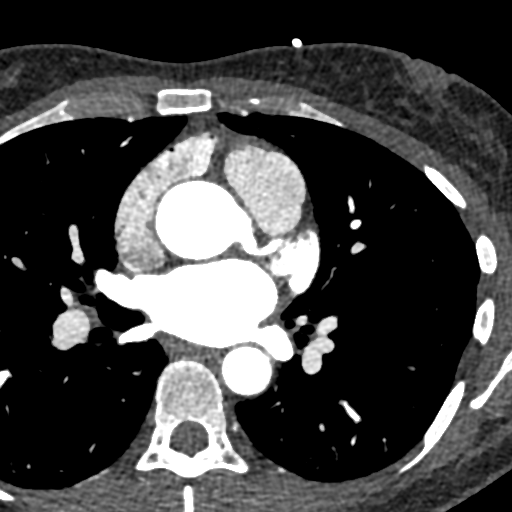
[im 300/343  vessel]
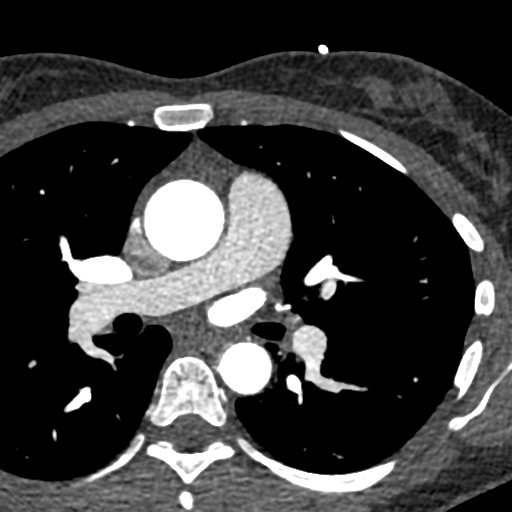

[14 of 20 positions shown; findings below may reference images not displayed]

FINDINGS: Coronary calcium score: The patient's coronary artery calcium score
is 0, which places the patient in the 0 percentile.

Coronary arteries: Normal coronary origins.  Right dominance.

Right Coronary Artery: No detectable plaque or stenosis. Patent PDA
and posterolateral branches.

Left Main Coronary Artery: No detectable plaque or stenosis

Left Anterior Descending Coronary Artery: No detectable plaque or
stenosis. In the mid to distal LAD there is a very superficial
intramyocardial course spanning approximately 3 to 4 cm of the
vessel. Possible deep interventricular groove with LAD coursing
through. No measurable overlying myocardium. Patent diagonal
branches and septal perforators.

Left Circumflex Artery: No detectable plaque or stenosis. Patent OM1
and large OM 2.

Aorta:  Normal size.  No calcifications.  No dissection.

Aortic Valve: Probable trileaflet.  No calcifications.

ASD closure device: The ASD closure device is well-seated. On cine
imaging in the multiphase view, there is no significant rocking, or
and no apparent dehiscence. No contrast jets are seen into the right
atrium in the phase of the cardiac cycle captured on cine imaging.
ASD closure device appears stable.

Other findings:

Normal pulmonary vein drainage into the left atrium. No anomalous
pulmonary venous drainage detected.

Normal left atrial appendage without a thrombus.

Normal size of the pulmonary artery.
IMPRESSION: 1. Coronary calcium score of 0. This was 0 percentile for age and
sex matched control.

2. Normal coronary origin with right dominance.

3. No evidence of CAD, CADRADS = 0.

4.  Stable ASD closure device.

EXAM:
OVER-READ INTERPRETATION  CT CHEST

The following report is an over-read performed by radiologist Dr.
Lakhdare Iskander [REDACTED] on 02/16/2018. This
over-read does not include interpretation of cardiac or coronary
anatomy or pathology. The coronary calcium score/coronary CTA
interpretation by the cardiologist is attached.
FINDINGS: Within the visualized portions of the thorax there are no suspicious
appearing pulmonary nodules or masses, there is no acute
consolidative airspace disease, no pleural effusions, no
pneumothorax and no lymphadenopathy. Visualized portions of the
upper abdomen are unremarkable. There are no aggressive appearing
lytic or blastic lesions noted in the visualized portions of the
skeleton.
IMPRESSION: No significant incidental noncardiac findings are noted.

## 2018-02-16 MED ORDER — NITROGLYCERIN 0.4 MG SL SUBL
0.8000 mg | SUBLINGUAL_TABLET | Freq: Once | SUBLINGUAL | Status: AC
  Administered 2018-02-16: 0.8 mg via SUBLINGUAL
  Filled 2018-02-16: qty 25

## 2018-02-16 MED ORDER — NITROGLYCERIN 0.4 MG SL SUBL
SUBLINGUAL_TABLET | SUBLINGUAL | Status: AC
  Filled 2018-02-16: qty 2

## 2018-02-16 MED ORDER — IOPAMIDOL (ISOVUE-370) INJECTION 76%
100.0000 mL | Freq: Once | INTRAVENOUS | Status: AC | PRN
  Administered 2018-02-16: 100 mL via INTRAVENOUS

## 2018-02-16 NOTE — Progress Notes (Signed)
CT complete. Patient denies any complaints. Patient eating crackers and having drink.

## 2018-02-16 NOTE — Progress Notes (Signed)
Patient ambulatory out of department with steady gait. Denies any complaints.  

## 2018-03-07 ENCOUNTER — Other Ambulatory Visit: Payer: Self-pay | Admitting: Obstetrics and Gynecology

## 2018-03-07 NOTE — Telephone Encounter (Signed)
Medication refill request: Deanna Davis  Last AEX:  03/30/17 Next AEX: 05/02/18 Last MMG (if hormonal medication request):  NA Refill authorized: #[id number] with 0 RF Please refill until her AEX if appropriate.

## 2018-04-24 NOTE — Progress Notes (Deleted)
[age] y.o. [id number] Married Other or two or more races Hispanic or Latino female here for annual exam.      No LMP recorded.          Sexually active: {yes no:314532}  The current method of family planning is {contraception:315051}.    Exercising: {yes no:314532}  {types:19826} Smoker:  {YES NO:22349}  Health Maintenance: Pap:  03/30/2017 WNL NEG HPV History of abnormal Pap:  no TDaP:  Unsure  Gardasil: no    reports that she has never smoked. She has never used smokeless tobacco. She reports that she does not drink alcohol or use drugs.  Past Medical History:  Diagnosis Date  . Atypical chest pain 11/15/2017  . Dizziness 06/01/2015  . Hypertension   . Weight loss 06/01/2015    Past Surgical History:  Procedure Laterality Date  . ATRIAL SEPTAL DEFECT(ASD) CLOSURE  03/2011  . CARDIAC SURGERY    . CESAREAN SECTION      Current Outpatient Medications  Medication Sig Dispense Refill  . amoxicillin (AMOXIL) 500 MG capsule 4 CAPS BY MOUTH 1 HOUR PRIOR TO DENTAL PROCEDURE (Patient not taking: Reported on 11/19/2017) 4 capsule 3  . azithromycin (ZITHROMAX) 250 MG tablet 2 tablets by mouth on day 1 then 1 tablet daily (Patient not taking: Reported on 02/16/2018) 6 each 0  . betamethasone valerate ointment (VALISONE) 0.1 % Apply 1 application topically 2 (two) times daily. (Patient not taking: Reported on 02/16/2018) 30 g 0  . ERRIN 0.35 MG tablet TAKE 1 TABLET BY MOUTH EVERY DAY 84 tablet 0  . losartan (COZAAR) 50 MG tablet Take 1 tablet (50 mg total) by mouth daily. 90 tablet 3  . metoprolol tartrate (LOPRESSOR) 50 MG tablet 1 TABLET BY MOUTH 1 HOUR PRIOR TO CT 1 tablet 0   No current facility-administered medications for this visit.     Family History  Problem Relation Age of Onset  . Hypertension Mother   . Stroke Mother   . Heart attack Mother   . Hypertension Father     Review of Systems  Exam:   There were no vitals taken for this visit.  Weight change: @WEIGHTCHANGE @ Height:       Ht Readings from Last 3 Encounters:  11/15/17 5\' 4"  (1.626 m)  09/08/17 5\' 4"  (1.626 m)  03/30/17 5\' 3"  (1.6 m)    General appearance: alert, cooperative and appears stated age Head: Normocephalic, without obvious abnormality, atraumatic Neck: no adenopathy, supple, symmetrical, trachea midline and thyroid {CHL AMB PHY EX THYROID NORM [phone]::"normal to inspection and palpation"} Lungs: clear to auscultation bilaterally Cardiovascular: regular rate and rhythm Breasts: {Exam; breast:13139::"normal appearance, no masses or tenderness"} Abdomen: soft, non-tender; non distended,  no masses,  no organomegaly Extremities: extremities normal, atraumatic, no cyanosis or edema Skin: Skin color, texture, turgor normal. No rashes or lesions Lymph nodes: Cervical, supraclavicular, and axillary nodes normal. No abnormal inguinal nodes palpated Neurologic: Grossly normal   Pelvic: External genitalia:  no lesions              Urethra:  normal appearing urethra with no masses, tenderness or lesions              Bartholins and Skenes: normal                 Vagina: normal appearing vagina with normal color and discharge, no lesions              Cervix: {CHL AMB PHY EX CERVIX NORM DEFAULT:[phone]::"no lesions"}  Bimanual Exam:  Uterus:  {CHL AMB PHY EX UTERUS NORM DEFAULT:[phone]::"normal size, contour, position, consistency, mobility, non-tender"}              Adnexa: {CHL AMB PHY EX ADNEXA NO MASS DEFAULT:[phone]::"no mass, fullness, tenderness"}               Rectovaginal: Confirms               Anus:  normal sphincter tone, no lesions  Chaperone was present for exam.  A:  Well Woman with normal exam  P:

## 2018-05-02 ENCOUNTER — Ambulatory Visit: Payer: 59 | Admitting: Obstetrics and Gynecology

## 2018-06-09 ENCOUNTER — Other Ambulatory Visit: Payer: Self-pay | Admitting: Obstetrics and Gynecology

## 2018-06-11 NOTE — Telephone Encounter (Signed)
Medication refill request: OCP Last AEX:  03/30/17 JJ Next AEX: 06/21/18 Last MMG (if hormonal medication request): n/a Refill authorized: 03/07/18 #84 w/0 refills; order pended #28 w/0 refills if authorized

## 2018-06-19 NOTE — Progress Notes (Signed)
38 y.o. G37P1001 Married Other or two or more races Hispanic or Latino female here for annual exam.  No dyspareunia, she is on the mini-pill and doing well.  She has continued issues with intermittent vulvar itching, wonders if it is her soap. Helped with the steroid cream, uses it 1-2 x a month. No abnormal d/c.   Period Cycle (Days): 28 Period Duration (Days): 5 days Period Pattern: Regular Menstrual Flow: Moderate, Heavy Menstrual Control: Thin pad Menstrual Control Change Freq (Hours): changes pad every 4 hours Dysmenorrhea: (!) Mild Dysmenorrhea Symptoms: Cramping  Patient's last menstrual period was 06/04/2018 (exact date).          Sexually active: Yes.    The current method of family planning is OCP (estrogen/progesterone).    Exercising: Yes.    cardio Smoker:  no  Health Maintenance: Pap:  03/30/2017 WNL NEG HPV  History of abnormal Pap:  No TDaP:  Unsure  Gardasil: No    reports that she has never smoked. She has never used smokeless tobacco. She reports that she does not drink alcohol or use drugs. Son is 73, moved here 4 years ago from [place]. She and her husband work for [employer] and [employer]. 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   Past Medical History:  Diagnosis Date  . Atypical chest pain 11/15/2017  . Dizziness 06/01/2015  . Hypertension   . Weight loss 06/01/2015    Past Surgical History:  Procedure Laterality Date  . ATRIAL SEPTAL DEFECT(ASD) CLOSURE  03/2011  . CARDIAC SURGERY    . CESAREAN SECTION      Current Outpatient Medications  Medication Sig Dispense Refill  . betamethasone valerate ointment (VALISONE) 0.1 % Apply 1 application topically 2 (two) times daily. 30 g 0  . losartan (COZAAR) 50 MG tablet Take 1 tablet (50 mg total) by mouth daily. 90 tablet 3  . norethindrone (MICRONOR,CAMILA,ERRIN) 0.35 MG tablet TAKE 1 TABLET BY MOUTH EVERY DAY 28 tablet 0  . amoxicillin (AMOXIL) 500 MG capsule 4 CAPS BY MOUTH 1 HOUR PRIOR TO DENTAL  PROCEDURE (Patient not taking: Reported on 11/19/2017) 4 capsule 3   No current facility-administered medications for this visit.     Family History  Problem Relation Age of Onset  . Hypertension Mother   . Stroke Mother   . Heart attack Mother   . Hypertension Father     Review of Systems  Constitutional: Negative.   HENT: Negative.   Eyes: Negative.   Respiratory: Negative.   Cardiovascular: Negative.   Gastrointestinal: Negative.   Endocrine: Negative.   Genitourinary: Negative.   Musculoskeletal: Negative.   Skin: Positive for rash.  Allergic/Immunologic: Negative.   Neurological: Negative.   Hematological: Negative.   Psychiatric/Behavioral: Negative.     Exam:   BP 112/70 (BP Location: Right Arm, Patient Position: Sitting, Cuff Size: Normal)   Pulse 64   Ht 5\' 3"  (1.6 m)   Wt 127 lb 9.6 oz (57.9 kg)   LMP 06/04/2018 (Exact Date)   BMI 22.60 kg/m   Weight change: @WEIGHTCHANGE @ Height:   Height: 5\' 3"  (160 cm)  Ht Readings from Last 3 Encounters:  06/21/18 5\' 3"  (1.6 m)  11/15/17 5\' 4"  (1.626 m)  09/08/17 5\' 4"  (1.626 m)    General appearance: alert, cooperative and appears stated age Head: Normocephalic, without obvious abnormality, atraumatic Neck: no adenopathy, supple, symmetrical, trachea midline and thyroid normal to inspection and palpation Lungs: clear to auscultation bilaterally Cardiovascular: regular rate and rhythm  Breasts: normal appearance, no masses or tenderness Abdomen: soft, non-tender; non distended,  no masses,  no organomegaly Extremities: extremities normal, atraumatic, no cyanosis or edema Skin: Skin color, texture, turgor normal. No rashes or lesions Lymph nodes: Cervical, supraclavicular, and axillary nodes normal. No abnormal inguinal nodes palpated Neurologic: Grossly normal   Pelvic: External genitalia:  no lesions              Urethra:  normal appearing urethra with no masses, tenderness or lesions              Bartholins  and Skenes: normal                 Vagina: normal appearing vagina with normal color and discharge, no lesions              Cervix: no lesions               Bimanual Exam:  Uterus:  normal size, contour, position, consistency, mobility, non-tender and retroverted              Adnexa: no mass, fullness, tenderness               Rectovaginal: Confirms               Anus:  normal sphincter tone, no lesions  Chaperone was present for exam.  A:  Well Woman with normal exam  Intermittent vuvlar/perianal itching  P:   No pap this year  Mammogram after her birthday  TDAP today  Labs with Cardiologist  Discussed breast self exam  Discussed calcium and vit D intake  Affirm  Steroid ointment for prn use  Vulvar skin care information given  Continue the micronor

## 2018-06-21 ENCOUNTER — Ambulatory Visit (INDEPENDENT_AMBULATORY_CARE_PROVIDER_SITE_OTHER): Payer: 59 | Admitting: Obstetrics and Gynecology

## 2018-06-21 ENCOUNTER — Other Ambulatory Visit: Payer: Self-pay

## 2018-06-21 ENCOUNTER — Encounter: Payer: Self-pay | Admitting: Obstetrics and Gynecology

## 2018-06-21 VITALS — BP 112/70 | HR 64 | Ht 63.0 in | Wt 127.6 lb

## 2018-06-21 DIAGNOSIS — Z23 Encounter for immunization: Secondary | ICD-10-CM | POA: Diagnosis not present

## 2018-06-21 DIAGNOSIS — N763 Subacute and chronic vulvitis: Secondary | ICD-10-CM | POA: Diagnosis not present

## 2018-06-21 DIAGNOSIS — Z7189 Other specified counseling: Secondary | ICD-10-CM | POA: Diagnosis not present

## 2018-06-21 DIAGNOSIS — Z01419 Encounter for gynecological examination (general) (routine) without abnormal findings: Secondary | ICD-10-CM

## 2018-06-21 DIAGNOSIS — Z7185 Encounter for immunization safety counseling: Secondary | ICD-10-CM

## 2018-06-21 MED ORDER — NORETHINDRONE 0.35 MG PO TABS: 1.0000 | ORAL_TABLET | Freq: Every day | ORAL | 3 refills | Status: DC

## 2018-06-21 MED ORDER — BETAMETHASONE VALERATE 0.1 % EX OINT: 1.0000 "application " | TOPICAL_OINTMENT | Freq: Two times a day (BID) | CUTANEOUS | 0 refills | Status: DC

## 2018-06-21 NOTE — Patient Instructions (Signed)
EXERCISE AND DIET:  We recommended that you start or continue a regular exercise program for good health. Regular exercise means any activity that makes your heart beat faster and makes you sweat.  We recommend exercising at least 30 minutes per day at least 3 days a week, preferably 4 or 5.  We also recommend a diet low in fat and sugar.  Inactivity, poor dietary choices and obesity can cause diabetes, heart attack, stroke, and kidney damage, among others.    ALCOHOL AND SMOKING:  Women should limit their alcohol intake to no more than 7 drinks/beers/glasses of wine (combined, not each!) per week. Moderation of alcohol intake to this level decreases your risk of breast cancer and liver damage. And of course, no recreational drugs are part of a healthy lifestyle.  And absolutely no smoking or even second hand smoke. Most people know smoking can cause heart and lung diseases, but did you know it also contributes to weakening of your bones? Aging of your skin?  Yellowing of your teeth and nails?  CALCIUM AND VITAMIN D:  Adequate intake of calcium and Vitamin D are recommended.  The recommendations for exact amounts of these supplements seem to change often, but generally speaking 1,000 mg of calcium (between diet and supplement) and 800 units of Vitamin D per day seems prudent. Certain women may benefit from higher intake of Vitamin D.  If you are among these women, your doctor will have told you during your visit.    PAP SMEARS:  Pap smears, to check for cervical cancer or precancers,  have traditionally been done yearly, although recent scientific advances have shown that most women can have pap smears less often.  However, every woman still should have a physical exam from her gynecologist every year. It will include a breast check, inspection of the vulva and vagina to check for abnormal growths or skin changes, a visual exam of the cervix, and then an exam to evaluate the size and shape of the uterus and  ovaries.  And after 40 years of age, a rectal exam is indicated to check for rectal cancers. We will also provide age appropriate advice regarding health maintenance, like when you should have certain vaccines, screening for sexually transmitted diseases, bone density testing, colonoscopy, mammograms, etc.   MAMMOGRAMS:  All women over 40 years old should have a yearly mammogram. Many facilities now offer a "3D" mammogram, which may cost around $50 extra out of pocket. If possible,  we recommend you accept the option to have the 3D mammogram performed.  It both reduces the number of women who will be called back for extra views which then turn out to be normal, and it is better than the routine mammogram at detecting truly abnormal areas.    COLON CANCER SCREENING: Now recommend starting at age 45. At this time colonoscopy is not covered for routine screening until 50. There are take home tests that can be done between 45-49.   COLONOSCOPY:  Colonoscopy to screen for colon cancer is recommended for all women at age 50.  We know, you hate the idea of the prep.  We agree, BUT, having colon cancer and not knowing it is worse!!  Colon cancer so often starts as a polyp that can be seen and removed at colonscopy, which can quite literally save your life!  And if your first colonoscopy is normal and you have no family history of colon cancer, most women don't have to have it again for   10 years.  Once every ten years, you can do something that may end up saving your life, right?  We will be happy to help you get it scheduled when you are ready.  Be sure to check your insurance coverage so you understand how much it will cost.  It may be covered as a preventative service at no cost, but you should check your particular policy.      Breast Self-Awareness Breast self-awareness means being familiar with how your breasts look and feel. It involves checking your breasts regularly and reporting any changes to your  health care provider. Practicing breast self-awareness is important. A change in your breasts can be a sign of a serious medical problem. Being familiar with how your breasts look and feel allows you to find any problems early, when treatment is more likely to be successful. All women should practice breast self-awareness, including women who have had breast implants. How to do a breast self-exam One way to learn what is normal for your breasts and whether your breasts are changing is to do a breast self-exam. To do a breast self-exam: Look for Changes  1. Remove all the clothing above your waist. 2. Stand in front of a mirror in a room with good lighting. 3. Put your hands on your hips. 4. Push your hands firmly downward. 5. Compare your breasts in the mirror. Look for differences between them (asymmetry), such as: ? Differences in shape. ? Differences in size. ? Puckers, dips, and bumps in one breast and not the other. 6. Look at each breast for changes in your skin, such as: ? Redness. ? Scaly areas. 7. Look for changes in your nipples, such as: ? Discharge. ? Bleeding. ? Dimpling. ? Redness. ? A change in position. Feel for Changes Carefully feel your breasts for lumps and changes. It is best to do this while lying on your back on the floor and again while sitting or standing in the shower or tub with soapy water on your skin. Feel each breast in the following way:  Place the arm on the side of the breast you are examining above your head.  Feel your breast with the other hand.  Start in the nipple area and make  inch (2 cm) overlapping circles to feel your breast. Use the pads of your three middle fingers to do this. Apply light pressure, then medium pressure, then firm pressure. The light pressure will allow you to feel the tissue closest to the skin. The medium pressure will allow you to feel the tissue that is a little deeper. The firm pressure will allow you to feel the tissue  close to the ribs.  Continue the overlapping circles, moving downward over the breast until you feel your ribs below your breast.  Move one finger-width toward the center of the body. Continue to use the  inch (2 cm) overlapping circles to feel your breast as you move slowly up toward your collarbone.  Continue the up and down exam using all three pressures until you reach your armpit.  Write Down What You Find  Write down what is normal for each breast and any changes that you find. Keep a written record with breast changes or normal findings for each breast. By writing this information down, you do not need to depend only on memory for size, tenderness, or location. Write down where you are in your menstrual cycle, if you are still menstruating. If you are having trouble noticing differences   in your breasts, do not get discouraged. With time you will become more familiar with the variations in your breasts and more comfortable with the exam. How often should I examine my breasts? Examine your breasts every month. If you are breastfeeding, the best time to examine your breasts is after a feeding or after using a breast pump. If you menstruate, the best time to examine your breasts is 5-7 days after your period is over. During your period, your breasts are lumpier, and it may be more difficult to notice changes. When should I see my health care provider? See your health care provider if you notice:  A change in shape or size of your breasts or nipples.  A change in the skin of your breast or nipples, such as a reddened or scaly area.  Unusual discharge from your nipples.  A lump or thick area that was not there before.  Pain in your breasts.  Anything that concerns you.    Contact Dermatitis Dermatitis is redness, soreness, and swelling (inflammation) of the skin. Contact dermatitis is a reaction to certain substances that touch the skin. Many different substances can cause contact  dermatitis. There are two types of contact dermatitis:  Irritant contact dermatitis. This type is caused by something that irritates your skin, such as having dry hands from washing them too often with soap. This type does not require previous exposure to the substance for a reaction to occur. This is the most common type.  Allergic contact dermatitis. This type is caused by a substance that you are allergic to, such as poison ivy. This type occurs when you have been exposed to the substance (allergen) and develop a sensitivity to it. Dermatitis may develop soon after your first exposure to the allergen, or it may not develop until the next time you are exposed and every time thereafter. What are the causes? Irritant contact dermatitis is most commonly caused by exposure to:  Makeup.  Soaps.  Detergents.  Bleaches.  Acids.  Metal salts, such as nickel. Allergic contact dermatitis is most commonly caused by exposure to:  Poisonous plants.  Chemicals.  Jewelry.  Latex.  Medicines.  Preservatives in products, such as clothing. What increases the risk? You are more likely to develop this condition if you have:  A job that exposes you to irritants or allergens.  Certain medical conditions, such as asthma or eczema. What are the signs or symptoms? Symptoms of this condition may occur on your body anywhere the irritant has touched you or is touched by you.  Symptoms include: ? Dryness or flaking. ? Redness. ? Cracks. ? Itching. ? Pain or a burning feeling. ? Blisters. ? Drainage of small amounts of blood or clear fluid from skin cracks. With allergic contact dermatitis, there may also be swelling in areas such as the eyelids, mouth, or genitals. How is this diagnosed? This condition is diagnosed with a medical history and physical exam.  A patch skin test may be performed to help determine the cause.  If the condition is related to your job, you may need to see an  occupational medicine specialist. How is this treated? This condition is treated by checking for the cause of the reaction and protecting your skin from further contact. Treatment may also include:  Steroid creams or ointments. Oral steroid medicines may be needed in more severe cases.  Antibiotic medicines or antibacterial ointments, if a skin infection is present.  Antihistamine lotion or an antihistamine taken by mouth  to ease itching.  A bandage (dressing). Follow these instructions at home: Skin care  Moisturize your skin as needed.  Apply cool compresses to the affected areas.  Try applying baking soda paste to your skin. Stir water into baking soda until it reaches a paste-like consistency.  Do not scratch your skin, and avoid friction to the affected area.  Avoid the use of soaps, perfumes, and dyes. Medicines  Take or apply over-the-counter and prescription medicines only as told by your health care provider.  If you were prescribed an antibiotic medicine, take or apply the antibiotic as told by your health care provider. Do not stop using the antibiotic even if your condition improves. Bathing  Try taking a bath with: ? Epsom salts. Follow the instructions on the packaging. You can get these at your local pharmacy or grocery store. ? Baking soda. Pour a small amount into the bath as directed by your health care provider. ? Colloidal oatmeal. Follow the instructions on the packaging. You can get this at your local pharmacy or grocery store.  Bathe less frequently, such as every other day.  Bathe in lukewarm water. Avoid using hot water. Bandage care  If you were given a bandage (dressing), change it as told by your health care provider.  Wash your hands with soap and water before and after you change your dressing. If soap and water are not available, use hand sanitizer. General instructions  Avoid the substance that caused your reaction. If you do not know what  caused it, keep a journal to try to track what caused it. Write down: ? What you eat. ? What cosmetic products you use. ? What you drink. ? What you wear in the affected area. This includes jewelry.  Check the affected areas every day for signs of infection. Check for: ? More redness, swelling, or pain. ? More fluid or blood. ? Warmth. ? Pus or a bad smell.  Keep all follow-up visits as told by your health care provider. This is important. Contact a health care provider if:  Your condition does not improve with treatment.  Your condition gets worse.  You have signs of infection such as swelling, tenderness, redness, soreness, or warmth in the affected area.  You have a fever.  You have new symptoms. Get help right away if:  You have a severe headache, neck pain, or neck stiffness.  You vomit.  You feel very sleepy.  You notice red streaks coming from the affected area.  Your bone or joint underneath the affected area becomes painful after the skin has healed.  The affected area turns darker.  You have difficulty breathing. Summary  Dermatitis is redness, soreness, and swelling (inflammation) of the skin. Contact dermatitis is a reaction to certain substances that touch the skin.  Symptoms of this condition may occur on your body anywhere the irritant has touched you or is touched by you.  This condition is treated by figuring out what caused the reaction and protecting your skin from further contact. Treatment may also include medicines and skin care.  Avoid the substance that caused your reaction. If you do not know what caused it, keep a journal to try to track what caused it.  Contact a health care provider if your condition gets worse or you have signs of infection such as swelling, tenderness, redness, soreness, or warmth in the affected area. This information is not intended to replace advice given to you by your health care provider. Make sure  you discuss any  questions you have with your health care provider. Document Released: 04/08/2000 Document Revised: 10/25/2017 Document Reviewed: 10/25/2017 Elsevier Interactive Patient Education  2019 [employer].

## 2018-06-22 LAB — VAGINITIS/VAGINOSIS, DNA PROBE
CANDIDA SPECIES: NEGATIVE
Gardnerella vaginalis: NEGATIVE
Trichomonas vaginosis: NEGATIVE

## 2018-09-23 ENCOUNTER — Other Ambulatory Visit: Payer: Self-pay | Admitting: Cardiovascular Disease

## 2018-09-23 DIAGNOSIS — I10 Essential (primary) hypertension: Secondary | ICD-10-CM

## 2018-12-19 ENCOUNTER — Other Ambulatory Visit: Payer: Self-pay | Admitting: Cardiovascular Disease

## 2018-12-19 DIAGNOSIS — I10 Essential (primary) hypertension: Secondary | ICD-10-CM

## 2019-01-17 ENCOUNTER — Other Ambulatory Visit: Payer: Self-pay | Admitting: Cardiovascular Disease

## 2019-01-17 DIAGNOSIS — I10 Essential (primary) hypertension: Secondary | ICD-10-CM

## 2019-02-06 ENCOUNTER — Other Ambulatory Visit: Payer: Self-pay | Admitting: Cardiovascular Disease

## 2019-02-06 MED ORDER — AMOXICILLIN 500 MG PO CAPS: ORAL_CAPSULE | ORAL | 3 refills | Status: AC

## 2019-02-06 NOTE — Telephone Encounter (Signed)
Rx(s) sent to pharmacy electronically.  

## 2019-02-06 NOTE — Telephone Encounter (Signed)
New Message:   Pt says she is having dental work next week. She wants Dr Oval Linsey to call in an antibiotic for her to [facility] on [address]please.

## 2019-02-16 ENCOUNTER — Other Ambulatory Visit: Payer: Self-pay | Admitting: Cardiovascular Disease

## 2019-02-16 DIAGNOSIS — I10 Essential (primary) hypertension: Secondary | ICD-10-CM

## 2019-02-28 ENCOUNTER — Encounter: Payer: Self-pay | Admitting: Cardiology

## 2019-02-28 ENCOUNTER — Other Ambulatory Visit: Payer: Self-pay

## 2019-02-28 ENCOUNTER — Ambulatory Visit (INDEPENDENT_AMBULATORY_CARE_PROVIDER_SITE_OTHER): Payer: 59 | Admitting: Cardiology

## 2019-02-28 VITALS — BP 124/74 | HR 71 | Temp 97.0°F | Ht 63.0 in | Wt 130.0 lb

## 2019-02-28 DIAGNOSIS — I10 Essential (primary) hypertension: Secondary | ICD-10-CM

## 2019-02-28 DIAGNOSIS — Z8774 Personal history of (corrected) congenital malformations of heart and circulatory system: Secondary | ICD-10-CM

## 2019-02-28 DIAGNOSIS — Q211 Atrial septal defect, unspecified: Secondary | ICD-10-CM

## 2019-02-28 DIAGNOSIS — R0789 Other chest pain: Secondary | ICD-10-CM | POA: Diagnosis not present

## 2019-02-28 NOTE — Assessment & Plan Note (Signed)
Controlled- Losartan 50 mg

## 2019-02-28 NOTE — Assessment & Plan Note (Signed)
No CAD by coronary CTA July 2019- Ca++ score 0

## 2019-02-28 NOTE — Patient Instructions (Signed)
Medication Instructions:  Your physician recommends that you continue on your current medications as directed. Please refer to the Current Medication list given to you today. *If you need a refill on your cardiac medications before your next appointment, please call your pharmacy*  Lab Work: None  If you have labs (blood work) drawn today and your tests are completely normal, you will receive your results only by: . MyChart Message (if you have MyChart) OR . A paper copy in the mail If you have any lab test that is abnormal or we need to change your treatment, we will call you to review the results.  Testing/Procedures: None   Follow-Up: At [facility], you and your health needs are our priority.  As part of our continuing mission to provide you with exceptional heart care, we have created designated Provider Care Teams.  These Care Teams include your primary Cardiologist (physician) and Advanced Practice Providers (APPs -  Physician Assistants and Nurse Practitioners) who all work together to provide you with the care you need, when you need it.  Your next appointment:   12 month(s)  The format for your next appointment:   In Person  Provider:   Tiffany Hillcrest, MD  Other Instructions  

## 2019-02-28 NOTE — Assessment & Plan Note (Addendum)
Dec 2011 in [place]-  Amplatzer device used- pt needs SBE prophylaxis prior to procedures Echo 2017- normal

## 2019-02-28 NOTE — Assessment & Plan Note (Signed)
Per pt, repaired in [place] 2011- 30 mm Amplatzer device

## 2019-02-28 NOTE — Progress Notes (Signed)
Cardiology Office Note:    Date:  02/28/2019   ID:  Deanna Davis, DOB [birth year], MRN [id number]  PCP:  Patient, No Pcp Per  Cardiologist:  Dr Oval Linsey Electrophysiologist:  None   Referring MD: No ref. provider found   No chief complaint on file.   History of Present Illness:    Deanna Davis is a pleasant 38 y.o. female with a hx of secundum ASD repaired with a 72mm Amplatzer device on 03/25/10. Her last echo was Feb 2017 and was normal.  She had a coronary CTA for chest pain in 2019 that showed normal coronaries and a Ca++ score of 0.  She is in the office today for routine follow up.  She has done well since we saw her last.  She needs to have dental work and Dr Oval Linsey has previously recommended SBE prophylaxis for procedures.   Past Medical History:  Diagnosis Date  . Atypical chest pain 11/15/2017  . Dizziness 06/01/2015  . Hypertension   . Weight loss 06/01/2015    Past Surgical History:  Procedure Laterality Date  . ATRIAL SEPTAL DEFECT(ASD) CLOSURE  03/2011  . CARDIAC SURGERY    . CESAREAN SECTION      Current Medications: Current Meds  Medication Sig  . amoxicillin (AMOXIL) 500 MG capsule 4 CAPS BY MOUTH 1 HOUR PRIOR TO DENTAL PROCEDURE  . betamethasone valerate ointment (VALISONE) 0.1 % Apply 1 application topically 2 (two) times daily.  Marland Kitchen losartan (COZAAR) 50 MG tablet TAKE 1 TABLET (50 MG TOTAL) BY MOUTH DAILY. APPOINTMENT NEEDED. 2ND ATTEMPT  . norethindrone (MICRONOR,CAMILA,ERRIN) 0.35 MG tablet Take 1 tablet (0.35 mg total) by mouth daily.     Allergies:   Patient has no known allergies.   Social History   Socioeconomic History  . Marital status: Married    Spouse name: Not on file  . Number of children: Not on file  . Years of education: Not on file  . Highest education level: Not on file  Occupational History  . Not on file  Social Needs  . Financial resource strain: Not on file  . Food insecurity    Worry: Not on file    Inability: Not on file   . Transportation needs    Medical: Not on file    Non-medical: Not on file  Tobacco Use  . Smoking status: Never Smoker  . Smokeless tobacco: Never Used  Substance and Sexual Activity  . Alcohol use: No    Alcohol/week: 0.0 standard drinks  . Drug use: No  . Sexual activity: Yes    Partners: Male    Birth control/protection: Pill  Lifestyle  . Physical activity    Days per week: Not on file    Minutes per session: Not on file  . Stress: Not on file  Relationships  . Social Herbalist on phone: Not on file    Gets together: Not on file    Attends religious service: Not on file    Active member of club or organization: Not on file    Attends meetings of clubs or organizations: Not on file    Relationship status: Not on file  Other Topics Concern  . Not on file  Social History Narrative  . Not on file     Family History: The patient's family history includes Heart attack in her mother; Hypertension in her father and mother; Stroke in her mother.  ROS:   Please see the history of present illness.  All other systems reviewed and are negative.  EKGs/Labs/Other Studies Reviewed:    The following studies were reviewed today: Echo 2017 Coronary CTA- July 2019  EKG:  EKG is ordered today.  The ekg ordered today demonstrates NSR-HR 70  Recent Labs: No results found for requested labs within last 8760 hours.  Recent Lipid Panel No results found for: CHOL, TRIG, HDL, CHOLHDL, VLDL, LDLCALC, LDLDIRECT  Physical Exam:    VS:  BP 124/74   Pulse 71   Temp (!) 97 F (36.1 C)   Ht 5\' 3"  (1.6 m)   Wt 130 lb (59 kg)   SpO2 99%   BMI 23.03 kg/m     Wt Readings from Last 3 Encounters:  02/28/19 130 lb (59 kg)  06/21/18 127 lb 9.6 oz (57.9 kg)  11/15/17 131 lb (59.4 kg)     GEN:  Well nourished, well developed in no acute distress HEENT: Normal NECK: No JVD; No carotid bruits LYMPHATICS: No lymphadenopathy CARDIAC: RRR, no murmurs, rubs,  gallops RESPIRATORY:  Clear to auscultation without rales, wheezing or rhonchi  ABDOMEN: Soft, non-tender, non-distended MUSCULOSKELETAL:  No edema; No deformity  SKIN: Warm and dry NEUROLOGIC:  Alert and oriented x 3 PSYCHIATRIC:  Normal affect   ASSESSMENT:    H/O congenital atrial septal defect (ASD) repair Dec 2011 in Jan 2012-  Amplatzer device used- pt needs SBE prophylaxis prior to procedures Echo 2017- normal  Essential hypertension Controlled- Losartan 50 mg  Atypical chest pain No CAD by coronary CTA July 2019- Ca++ score 0   PLAN:    SBE prior to dental work per Dr August 2019.  F/U echo in 2022 per Dr 2023, OV with Dr Duke Salvia next November.    Medication Adjustments/Labs and Tests Ordered: Current medicines are reviewed at length with the patient today.  Concerns regarding medicines are outlined above.  Orders Placed This Encounter  Procedures  . EKG 12-Lead   No orders of the defined types were placed in this encounter.   Patient Instructions  Medication Instructions:  Your physician recommends that you continue on your current medications as directed. Please refer to the Current Medication list given to you today. *If you need a refill on your cardiac medications before your next appointment, please call your pharmacy*  Lab Work: None  If you have labs (blood work) drawn today and your tests are completely normal, you will receive your results only by: December MyChart Message (if you have MyChart) OR . A paper copy in the mail If you have any lab test that is abnormal or we need to change your treatment, we will call you to review the results.  Testing/Procedures: None   Follow-Up: At [facility], you and your health needs are our priority.  As part of our continuing mission to provide you with exceptional heart care, we have created designated Provider Care Teams.  These Care Teams include your primary Cardiologist (physician) and Advanced Practice  Providers (APPs -  Physician Assistants and Nurse Practitioners) who all work together to provide you with the care you need, when you need it.  Your next appointment:   12 months  The format for your next appointment:   In Person  Provider:   [facility], MD  Other Instructions     Signed, Chilton Si, PA-C  02/28/2019 3:54 PM    [facility]

## 2019-03-13 ENCOUNTER — Other Ambulatory Visit: Payer: Self-pay | Admitting: Cardiovascular Disease

## 2019-03-13 DIAGNOSIS — I10 Essential (primary) hypertension: Secondary | ICD-10-CM

## 2019-03-15 NOTE — Telephone Encounter (Signed)
Rx(s) sent to pharmacy electronically.  

## 2019-06-20 ENCOUNTER — Other Ambulatory Visit: Payer: Self-pay

## 2019-06-24 ENCOUNTER — Encounter: Payer: Self-pay | Admitting: Obstetrics and Gynecology

## 2019-06-24 ENCOUNTER — Other Ambulatory Visit: Payer: Self-pay

## 2019-06-24 ENCOUNTER — Ambulatory Visit (INDEPENDENT_AMBULATORY_CARE_PROVIDER_SITE_OTHER): Payer: 59 | Admitting: Obstetrics and Gynecology

## 2019-06-24 ENCOUNTER — Ambulatory Visit: Payer: 59 | Admitting: Obstetrics and Gynecology

## 2019-06-24 VITALS — BP 122/64 | HR 75 | Temp 98.2°F | Ht 63.0 in | Wt 127.0 lb

## 2019-06-24 DIAGNOSIS — R8290 Unspecified abnormal findings in urine: Secondary | ICD-10-CM | POA: Diagnosis not present

## 2019-06-24 DIAGNOSIS — Z0000 Encounter for general adult medical examination without abnormal findings: Secondary | ICD-10-CM | POA: Diagnosis not present

## 2019-06-24 DIAGNOSIS — I10 Essential (primary) hypertension: Secondary | ICD-10-CM

## 2019-06-24 DIAGNOSIS — Z3041 Encounter for surveillance of contraceptive pills: Secondary | ICD-10-CM

## 2019-06-24 DIAGNOSIS — Z8774 Personal history of (corrected) congenital malformations of heart and circulatory system: Secondary | ICD-10-CM

## 2019-06-24 DIAGNOSIS — Z01419 Encounter for gynecological examination (general) (routine) without abnormal findings: Secondary | ICD-10-CM

## 2019-06-24 MED ORDER — BETAMETHASONE VALERATE 0.1 % EX OINT: 1.0000 "application " | TOPICAL_OINTMENT | Freq: Two times a day (BID) | CUTANEOUS | 0 refills | Status: AC

## 2019-06-24 MED ORDER — NORETHINDRONE 0.35 MG PO TABS: 1.0000 | ORAL_TABLET | Freq: Every day | ORAL | 3 refills | Status: DC

## 2019-06-24 NOTE — Progress Notes (Signed)
39 y.o. G43P1001 Married Other or two or more races Hispanic or Latino female here for annual exam.  Patient states that her urine has an odor to it. She states that she has been trying to drink more water but she still smell the odor. The odor has been there for 3 months. No vitamins or change in diet. Some intermittent pressure to void. Period Cycle (Days): 28 Period Duration (Days): 5 Period Pattern: Regular Menstrual Flow: Moderate Menstrual Control: Thin pad Menstrual Control Change Freq (Hours): 6-8 Dysmenorrhea: None  She c/o continued intermittent vulvar itching, she uses steroid ointment ~1 x a month. Currently feels okay.   Patient's last menstrual period was 05/27/2019 (approximate).          Sexually active: Yes.    The current method of family planning is POP (estrogen/progesterone).    Exercising: Yes.    The patient does not participate in regular exercise at present. Smoker:  no  Health Maintenance: Pap: 03/30/17 WNL NEG HPV  History of abnormal Pap:  no MMG:  None  BMD:   None  Colonoscopy: none  TDaP:  06/21/18  Gardasil: NA   reports that she has never smoked. She has never used smokeless tobacco. She reports that she does not drink alcohol or use drugs. She is originally from [place]  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 Her son is 10.  Past Medical History:  Diagnosis Date  . Atypical chest pain 11/15/2017  . Dizziness 06/01/2015  . Hypertension   . Weight loss 06/01/2015    Past Surgical History:  Procedure Laterality Date  . ATRIAL SEPTAL DEFECT(ASD) CLOSURE  03/2011  . CARDIAC SURGERY    . CESAREAN SECTION      Current Outpatient Medications  Medication Sig Dispense Refill  . amoxicillin (AMOXIL) 500 MG capsule 4 CAPS BY MOUTH 1 HOUR PRIOR TO DENTAL PROCEDURE 4 capsule 3  . betamethasone valerate ointment (VALISONE) 0.1 % Apply 1 application topically 2 (two) times daily. 30 g 0  . losartan (COZAAR)  50 MG tablet Take 1 tablet (50 mg total) by mouth daily. 90 tablet 3  . norethindrone (MICRONOR,CAMILA,ERRIN) 0.35 MG tablet Take 1 tablet (0.35 mg total) by mouth daily. 84 tablet 3   No current facility-administered medications for this visit.    Family History  Problem Relation Age of Onset  . Hypertension Mother   . Stroke Mother   . Heart attack Mother   . Hypertension Father     Review of Systems  All other systems reviewed and are negative.   Exam:   BP 122/64   Pulse 75   Temp 98.2 F (36.8 C)   Ht 5\' 3"  (1.6 m)   Wt 127 lb (57.6 kg)   LMP 05/27/2019 (Approximate)   SpO2 99%   BMI 22.50 kg/m   Weight change: @WEIGHTCHANGE @ Height:   Height: 5\' 3"  (160 cm)  Ht Readings from Last 3 Encounters:  06/24/19 5\' 3"  (1.6 m)  02/28/19 5\' 3"  (1.6 m)  06/21/18 5\' 3"  (1.6 m)    General appearance: alert, cooperative and appears stated age Head: Normocephalic, without obvious abnormality, atraumatic Neck: no adenopathy, supple, symmetrical, trachea midline and thyroid normal to inspection and palpation Lungs: clear to auscultation bilaterally Cardiovascular: regular rate and rhythm Breasts: normal appearance, no masses or tenderness Abdomen: soft, non-tender; non distended,  no masses,  no organomegaly Extremities: extremities normal, atraumatic, no cyanosis or edema Skin: Skin  color, texture, turgor normal. No rashes or lesions Lymph nodes: Cervical, supraclavicular, and axillary nodes normal. No abnormal inguinal nodes palpated Neurologic: Grossly normal   Pelvic: External genitalia:  no lesions              Urethra:  normal appearing urethra with no masses, tenderness or lesions              Bartholins and Skenes: normal                 Vagina: normal appearing vagina with normal color and discharge, no lesions              Cervix: no lesions               Bimanual Exam:  Uterus:  normal size, contour, position, consistency, mobility, non-tender               Adnexa: no mass, fullness, tenderness               Rectovaginal: Confirms               Anus:  normal sphincter tone, no lesions  Evern Core chaperoned for the exam.  A:  Well Woman with normal exam  Urine odor  Contraception, on micronor. Will continue  P:   No pap this year  Mammogram due, # given  Discussed breast self exam  Discussed calcium and vit D intake  Continue POP  Screening labs  Urine for ua, c&s

## 2019-06-24 NOTE — Patient Instructions (Signed)

## 2019-06-25 LAB — CBC
Hematocrit: 38.6 % (ref 34.0–46.6)
Hemoglobin: 12.6 g/dL (ref 11.1–15.9)
MCH: 29.9 pg (ref 26.6–33.0)
MCHC: 32.6 g/dL (ref 31.5–35.7)
MCV: 92 fL (ref 79–97)
Platelets: 321 10*3/uL (ref 150–450)
RBC: 4.22 x10E6/uL (ref 3.77–5.28)
RDW: 12.8 % (ref 11.7–15.4)
WBC: 7.8 10*3/uL (ref 3.4–10.8)

## 2019-06-25 LAB — LIPID PANEL
Chol/HDL Ratio: 2.2 ratio (ref 0.0–4.4)
Cholesterol, Total: 130 mg/dL (ref 100–199)
HDL: 58 mg/dL (ref >39)
LDL Chol Calc (NIH): 62 mg/dL (ref 0–99)
Triglycerides: 39 mg/dL (ref 0–149)
VLDL Cholesterol Cal: 10 mg/dL (ref 5–40)

## 2019-06-25 LAB — COMPREHENSIVE METABOLIC PANEL
ALT: 19 IU/L (ref 0–32)
AST: 18 IU/L (ref 0–40)
Albumin/Globulin Ratio: 1.7 (ref 1.2–2.2)
Albumin: 4.5 g/dL (ref 3.8–4.8)
Alkaline Phosphatase: 83 IU/L (ref 39–117)
BUN/Creatinine Ratio: 27 — ABNORMAL HIGH (ref 9–23)
BUN: 16 mg/dL (ref 6–24)
Bilirubin Total: 0.6 mg/dL (ref 0.0–1.2)
CO2: 22 mmol/L (ref 20–29)
Calcium: 9.5 mg/dL (ref 8.7–10.2)
Chloride: 104 mmol/L (ref 96–106)
Creatinine, Ser: 0.59 mg/dL (ref 0.57–1.00)
GFR calc Af Amer: 133 mL/min/{1.73_m2} (ref >59)
GFR calc non Af Amer: 115 mL/min/{1.73_m2} (ref >59)
Globulin, Total: 2.7 g/dL (ref 1.5–4.5)
Glucose: 80 mg/dL (ref 65–99)
Potassium: 4.9 mmol/L (ref 3.5–5.2)
Sodium: 138 mmol/L (ref 134–144)
Total Protein: 7.2 g/dL (ref 6.0–8.5)

## 2019-06-25 LAB — URINALYSIS, MICROSCOPIC ONLY: Casts: NONE SEEN /lpf

## 2019-06-26 ENCOUNTER — Telehealth: Payer: Self-pay | Admitting: *Deleted

## 2019-06-26 LAB — URINE CULTURE

## 2019-06-26 MED ORDER — SULFAMETHOXAZOLE-TRIMETHOPRIM 800-160 MG PO TABS: 1.0000 | ORAL_TABLET | Freq: Two times a day (BID) | ORAL | 0 refills | Status: AC

## 2019-06-26 NOTE — Telephone Encounter (Signed)
MyChart message reviewed by patient.   Encounter closed.

## 2019-06-26 NOTE — Telephone Encounter (Signed)
-----   Message from Romualdo Bolk, MD sent at 06/26/2019  4:08 PM EST ----- Please inform the patient that she does have a UTI and call in Bactrim DS, 1 po BID x 3 days.

## 2019-06-26 NOTE — Telephone Encounter (Signed)
Leda Min, RN  06/26/2019 4:20 PM EST    MyChart message to patient with results. Bactrim Rx to pharmacy on file.   See telephone encounter dated 06/26/19   Leda Min, RN  06/26/2019 4:15 PM EST    Call placed to patient, no answer. Voicemail not set up, unable to leave message.

## 2020-01-28 ENCOUNTER — Telehealth: Payer: Self-pay | Admitting: Cardiovascular Disease

## 2020-01-28 NOTE — Telephone Encounter (Signed)
Attempted to contact patient to schedule follow up with Deanna Davis from recall list, patient line has busy signal

## 2020-04-12 ENCOUNTER — Other Ambulatory Visit: Payer: Self-pay | Admitting: Cardiovascular Disease

## 2020-04-12 DIAGNOSIS — I10 Essential (primary) hypertension: Secondary | ICD-10-CM

## 2020-04-30 ENCOUNTER — Encounter: Payer: Self-pay | Admitting: Cardiovascular Disease

## 2020-06-07 ENCOUNTER — Other Ambulatory Visit: Payer: Self-pay | Admitting: Cardiovascular Disease

## 2020-06-07 DIAGNOSIS — I10 Essential (primary) hypertension: Secondary | ICD-10-CM

## 2020-06-19 ENCOUNTER — Other Ambulatory Visit: Payer: Self-pay | Admitting: Cardiovascular Disease

## 2020-06-19 DIAGNOSIS — I10 Essential (primary) hypertension: Secondary | ICD-10-CM

## 2020-06-25 ENCOUNTER — Ambulatory Visit: Payer: 59 | Admitting: Obstetrics and Gynecology

## 2020-06-29 NOTE — Progress Notes (Signed)
40 y.o. G12P1001 Married Other or two or more races Hispanic or Latino female here for annual exam.  No problems. No dyspareunia, no bowel or bladder c/o.  Period Cycle (Days): 28 Period Duration (Days): 5 Period Pattern: Regular Menstrual Flow: Moderate Menstrual Control: Panty liner,Maxi pad Menstrual Control Change Freq (Hours): 4 Dysmenorrhea: None  Patient's last menstrual period was 06/02/2020.          Sexually active: Yes.    The current method of family planning is oral progesterone-only contraceptive.    Exercising: No.  The patient does not participate in regular exercise at present. Smoker:  no  Health Maintenance: Pap:  03/30/17 WNL NEG HPV  History of abnormal Pap:  no MMG:  Last year through work, told normal.   BMD:   none Colonoscopy: none  TDaP:  06/21/18 Gardasil: none, reviewed, declines.     reports that she has never smoked. She has never used smokeless tobacco. She reports that she does not drink alcohol and does not use drugs. 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 Her son is 59.  Past Medical History:  Diagnosis Date  . Atypical chest pain 11/15/2017  . Dizziness 06/01/2015  . Hypertension   . Weight loss 06/01/2015    Past Surgical History:  Procedure Laterality Date  . ATRIAL SEPTAL DEFECT(ASD) CLOSURE  03/2011  . CARDIAC SURGERY    . CESAREAN SECTION      Current Outpatient Medications  Medication Sig Dispense Refill  . amoxicillin (AMOXIL) 500 MG capsule 4 CAPS BY MOUTH 1 HOUR PRIOR TO DENTAL PROCEDURE 4 capsule 3  . betamethasone valerate ointment (VALISONE) 0.1 % Apply 1 application topically 2 (two) times daily. Use prn, not for daily long term use. 30 g 0  . losartan (COZAAR) 50 MG tablet TAKE 1 TABLET BY MOUTH EVERY DAY 15 tablet 0  . norethindrone (MICRONOR) 0.35 MG tablet Take 1 tablet (0.35 mg total) by mouth daily. 84 tablet 3   No current facility-administered medications for this visit.     Family History  Problem Relation Age of Onset  . Hypertension Mother   . Stroke Mother   . Heart attack Mother   . Hypertension Father     Review of Systems  All other systems reviewed and are negative.   Exam:   BP 100/70   Pulse 86   Ht 5\' 4"  (1.626 m)   Wt 130 lb (59 kg)   LMP 06/02/2020   SpO2 100%   BMI 22.31 kg/m   Weight change: @WEIGHTCHANGE @ Height:   Height: 5\' 4"  (162.6 cm)  Ht Readings from Last 3 Encounters:  06/30/20 5\' 4"  (1.626 m)  06/24/19 5\' 3"  (1.6 m)  02/28/19 5\' 3"  (1.6 m)    General appearance: alert, cooperative and appears stated age Head: Normocephalic, without obvious abnormality, atraumatic Neck: no adenopathy, supple, symmetrical, trachea midline and thyroid normal to inspection and palpation Lungs: clear to auscultation bilaterally Cardiovascular: regular rate and rhythm Breasts: normal appearance, no masses or tenderness Abdomen: soft, non-tender; non distended,  no masses,  no organomegaly Extremities: extremities normal, atraumatic, no cyanosis or edema Skin: Skin color, texture, turgor normal. No rashes or lesions Lymph nodes: Cervical, supraclavicular, and axillary nodes normal. No abnormal inguinal nodes palpated Neurologic: Grossly normal   Pelvic: External genitalia:  no lesions              Urethra:  normal appearing urethra with no masses, tenderness  or lesions              Bartholins and Skenes: normal                 Vagina: normal appearing vagina with normal color and discharge, no lesions              Cervix: no lesions               Bimanual Exam:  Uterus:  normal size, contour, position, consistency, mobility, non-tender and retroverted              Adnexa: no mass, fullness, tenderness               Rectovaginal: Confirms               Anus:  normal sphincter tone, no lesions    1. Well woman exam Discussed breast self exam Discussed calcium and vit D intake No pap this year # for breast center given, she  will schedule her mammogram  2. Encounter for surveillance of contraceptive pills Doing well - norethindrone (MICRONOR) 0.35 MG tablet; Take 1 tablet (0.35 mg total) by mouth daily.  Dispense: 84 tablet; Refill: 3

## 2020-06-30 ENCOUNTER — Encounter: Payer: Self-pay | Admitting: Obstetrics and Gynecology

## 2020-06-30 ENCOUNTER — Ambulatory Visit (INDEPENDENT_AMBULATORY_CARE_PROVIDER_SITE_OTHER): Payer: 59 | Admitting: Obstetrics and Gynecology

## 2020-06-30 ENCOUNTER — Other Ambulatory Visit: Payer: Self-pay

## 2020-06-30 VITALS — BP 100/70 | HR 86 | Ht 64.0 in | Wt 130.0 lb

## 2020-06-30 DIAGNOSIS — Z01419 Encounter for gynecological examination (general) (routine) without abnormal findings: Secondary | ICD-10-CM | POA: Diagnosis not present

## 2020-06-30 DIAGNOSIS — Z3041 Encounter for surveillance of contraceptive pills: Secondary | ICD-10-CM

## 2020-06-30 MED ORDER — NORETHINDRONE 0.35 MG PO TABS: 1.0000 | ORAL_TABLET | Freq: Every day | ORAL | 3 refills | Status: DC

## 2020-06-30 NOTE — Patient Instructions (Signed)

## 2020-07-09 ENCOUNTER — Other Ambulatory Visit: Payer: Self-pay | Admitting: Cardiovascular Disease

## 2020-07-09 DIAGNOSIS — I10 Essential (primary) hypertension: Secondary | ICD-10-CM

## 2020-10-24 ENCOUNTER — Other Ambulatory Visit: Payer: Self-pay | Admitting: Cardiovascular Disease

## 2020-10-24 DIAGNOSIS — I10 Essential (primary) hypertension: Secondary | ICD-10-CM

## 2020-10-27 NOTE — Telephone Encounter (Signed)
Please call pt to schedule overdue follow-up appointment with Dr. Duke Salvia for refills. Last seen 2020. Thank you!

## 2020-10-27 NOTE — Telephone Encounter (Signed)
Patient is scheduled for 02/01/21 with Dr. Duke Salvia

## 2020-11-04 ENCOUNTER — Other Ambulatory Visit: Payer: Self-pay

## 2020-11-04 ENCOUNTER — Other Ambulatory Visit: Payer: Self-pay | Admitting: Cardiovascular Disease

## 2020-11-04 DIAGNOSIS — I10 Essential (primary) hypertension: Secondary | ICD-10-CM

## 2020-11-04 MED ORDER — LOSARTAN POTASSIUM 50 MG PO TABS: ORAL_TABLET | ORAL | 0 refills | Status: DC

## 2020-11-08 ENCOUNTER — Other Ambulatory Visit: Payer: Self-pay | Admitting: Cardiovascular Disease

## 2020-11-08 DIAGNOSIS — I10 Essential (primary) hypertension: Secondary | ICD-10-CM

## 2020-11-09 ENCOUNTER — Telehealth: Payer: Self-pay | Admitting: Cardiovascular Disease

## 2020-11-09 NOTE — Telephone Encounter (Signed)
*  STAT* If patient is at the pharmacy, call can be transferred to refill team.   1. Which medications need to be refilled? (please list name of each medication and dose if known)  losartan (COZAAR) 50 MG tablet  2. Which pharmacy/location (including street and city if local pharmacy) is medication to be sent to? [id number] - [place], [place] - [address] AT [address] [facility] [address]  3. Do they need a 30 day or 90 day supply? 90 with refills    Patient said her pharmacy denied her refill request

## 2020-11-14 ENCOUNTER — Other Ambulatory Visit: Payer: Self-pay | Admitting: Cardiovascular Disease

## 2020-11-14 DIAGNOSIS — I10 Essential (primary) hypertension: Secondary | ICD-10-CM

## 2020-11-27 ENCOUNTER — Other Ambulatory Visit: Payer: Self-pay | Admitting: Cardiovascular Disease

## 2020-11-27 DIAGNOSIS — I10 Essential (primary) hypertension: Secondary | ICD-10-CM

## 2020-12-22 ENCOUNTER — Other Ambulatory Visit: Payer: Self-pay | Admitting: Cardiovascular Disease

## 2020-12-22 DIAGNOSIS — I10 Essential (primary) hypertension: Secondary | ICD-10-CM

## 2021-02-01 ENCOUNTER — Other Ambulatory Visit: Payer: Self-pay

## 2021-02-01 ENCOUNTER — Encounter (HOSPITAL_BASED_OUTPATIENT_CLINIC_OR_DEPARTMENT_OTHER): Payer: Self-pay | Admitting: Cardiovascular Disease

## 2021-02-01 ENCOUNTER — Ambulatory Visit (INDEPENDENT_AMBULATORY_CARE_PROVIDER_SITE_OTHER): Payer: 59 | Admitting: Cardiovascular Disease

## 2021-02-01 DIAGNOSIS — L659 Nonscarring hair loss, unspecified: Secondary | ICD-10-CM | POA: Diagnosis not present

## 2021-02-01 DIAGNOSIS — Z8774 Personal history of (corrected) congenital malformations of heart and circulatory system: Secondary | ICD-10-CM | POA: Diagnosis not present

## 2021-02-01 DIAGNOSIS — I10 Essential (primary) hypertension: Secondary | ICD-10-CM

## 2021-02-01 HISTORY — DX: Nonscarring hair loss, unspecified: L65.9

## 2021-02-01 MED ORDER — LOSARTAN POTASSIUM 50 MG PO TABS: 50.0000 mg | ORAL_TABLET | Freq: Every day | ORAL | 3 refills | Status: DC

## 2021-02-01 NOTE — Assessment & Plan Note (Signed)
She reports thinning and hair loss over the last several years.  This has been ongoing since she moved from [place] six years ago.  OK to try a biotin supplement.

## 2021-02-01 NOTE — Assessment & Plan Note (Signed)
Blood pressure is very slightly elevated.  Given that her ASCVD 10-year risk is low, her blood pressure goal is technically less than 140/90.  We did discuss increasing her exercise and limiting the sodium in her diet so that we do not need to make further increases in her antihypertensive medicine.  Continue losartan for now.  Given that she only follows up every 2 years, it is okay for her to have refills for her losartan for that duration of time.

## 2021-02-01 NOTE — Progress Notes (Signed)
Cardiology Office Note   Date:  02/01/2021   ID:  Deanna Davis, DOB [birth year], MRN [id number]  PCP:  Patient, No Pcp Per (Inactive)  Cardiologist:   Chilton Si, MD   No chief complaint on file.   History of Present Illness: Deanna Davis is a 40 y.o. female with hypertension and an ASD s/p repair here for follow up.  Deanna Davis had a secundum ASD repaired with a 12mm Amplatzer device on 03/25/10.  She developed intermittent substernal chest tightness. Her symptoms were atypical and she was referred for coronary CT 01/2018. She had no coronary disease and her ASD device was well-seated.  Today, she is feeling well overall. At home, she sometimes check her blood pressure which is usually 125-130/80. For a month she has been using valisone prescribed by her dermatologist for an erythematous patch of skin on her right face. Also, she endorses some hair loss that seems to be a similar severity to when this occurred in the past. For exercise she walks 2-3 times in her neighborhood for about 15 minutes, and continues to work on increasing this time. She denies any exertional symptoms. For her diet, she considers herself to have a normal sodium intake. She denies any palpitations, chest pain, or shortness of breath. No lightheadedness, headaches, syncope, orthopnea, or PND. Also has no lower extremity edema.  Past Medical History:  Diagnosis Date   Atypical chest pain 11/15/2017   Dizziness 06/01/2015   Hair loss 02/01/2021   Hypertension    Weight loss 06/01/2015    Past Surgical History:  Procedure Laterality Date   ATRIAL SEPTAL DEFECT(ASD) CLOSURE  03/2011   CARDIAC SURGERY     CESAREAN SECTION       Current Outpatient Medications  Medication Sig Dispense Refill   amoxicillin (AMOXIL) 500 MG capsule 4 CAPS BY MOUTH 1 HOUR PRIOR TO DENTAL PROCEDURE 4 capsule 3   betamethasone valerate ointment (VALISONE) 0.1 % Apply 1 application topically 2 (two) times daily. Use prn, not for  daily long term use. 30 g 0   losartan (COZAAR) 50 MG tablet TAKE 1 TABLET BY MOUTH EVERY DAY. Patient needs an appointment for future refills 30 tablet 2   norethindrone (MICRONOR) 0.35 MG tablet Take 1 tablet (0.35 mg total) by mouth daily. 84 tablet 3   No current facility-administered medications for this visit.    Allergies:   Patient has no known allergies.    Social History:  The patient  reports that she has never smoked. She has never used smokeless tobacco. She reports that she does not drink alcohol and does not use drugs.   Family History:  The patient's family history includes Heart attack in her mother; Hypertension in her father and mother; Stroke in her mother.    ROS:   Please see the history of present illness. (+) Hair loss All other systems are reviewed and negative.     PHYSICAL EXAM: VS:  BP 134/72 (BP Location: Left Arm, Patient Position: Sitting, Cuff Size: Normal)   Pulse 81   Ht 5\' 4"  (1.626 m)   Wt 130 lb 12.8 oz (59.3 kg)   BMI 22.45 kg/m  , BMI Body mass index is 22.45 kg/m. GENERAL:  Well appearing HEENT: Pupils equal round and reactive, fundi not visualized, oral mucosa unremarkable NECK:  No jugular venous distention, waveform within normal limits, carotid upstroke brisk and symmetric, no bruits LUNGS:  Clear to auscultation bilaterally HEART:  RRR.  PMI not displaced or  sustained,S1 and S2 within normal limits, no S3, no S4, no clicks, no rubs, no murmurs ABD:  Flat, positive bowel sounds normal in frequency in pitch, no bruits, no rebound, no guarding, no midline pulsatile mass, no hepatomegaly, no splenomegaly EXT:  2 plus pulses throughout, no edema, no cyanosis no clubbing SKIN:  No rashes no nodules NEURO:  Cranial nerves II through XII grossly intact, motor grossly intact throughout PSYCH:  Cognitively intact, oriented to person place and time   EKG:   02/01/2021: Sinus rhythm. Rate 81 bpm. Incomplete RBBB. 11/15/17: Sinus rhythm.   Rate 92 bpm.  Incomplete RBBB.  09/08/17:  Sinus rhythm.  Rate 87 bpm.  Incomplete RBBB.  06/01/15: sinus rhythm.  Rate 76 bpm.  Incomplete RBBB.     Cardiac CT w/Calcium Score 02/16/2018: FINDINGS: Coronary calcium score: The patient's coronary artery calcium score is 0, which places the patient in the 0 percentile.   Coronary arteries: Normal coronary origins.  Right dominance.   Right Coronary Artery: No detectable plaque or stenosis. Patent PDA and posterolateral branches.   Left Main Coronary Artery: No detectable plaque or stenosis   Left Anterior Descending Coronary Artery: No detectable plaque or stenosis. In the mid to distal LAD there is a very superficial intramyocardial course spanning approximately 3 to 4 cm of the vessel. Possible deep interventricular groove with LAD coursing through. No measurable overlying myocardium. Patent diagonal branches and septal perforators.   Left Circumflex Artery: No detectable plaque or stenosis. Patent OM1 and large OM 2.   Aorta:  Normal size.  No calcifications.  No dissection.   Aortic Valve: Probable trileaflet.  No calcifications.   ASD closure device: The ASD closure device is well-seated. On cine imaging in the multiphase view, there is no significant rocking, or and no apparent dehiscence. No contrast jets are seen into the right atrium in the phase of the cardiac cycle captured on cine imaging. ASD closure device appears stable.   Other findings:   Normal pulmonary vein drainage into the left atrium. No anomalous pulmonary venous drainage detected.   Normal left atrial appendage without a thrombus.   Normal size of the pulmonary artery.   IMPRESSION: 1. Coronary calcium score of 0. This was 0 percentile for age and sex matched control.   2. Normal coronary origin with right dominance.   3. No evidence of CAD, CADRADS = 0.   4.  Stable ASD closure device.  Echo 06/15/15: Study Conclusions   - Left ventricle:  The cavity size was normal. Wall thickness was   normal. Systolic function was normal. The estimated ejection   fraction was in the range of 60% to 65%. Wall motion was normal;   there were no regional wall motion abnormalities. - Atrial septum: No defect or patent foramen ovale was identified.   A patch was present.  Echo 01/24/12: LVEF >55%.  Repaired secundum ASD (Amplatzer) without residual shunt.  Trivial PR, MR.  Mild TR.    Recent Labs: No results found for requested labs within last 8760 hours.    Lipid Panel    Component Value Date/Time   CHOL 130 06/24/2019 1350   TRIG 39 06/24/2019 1350   HDL 58 06/24/2019 1350   CHOLHDL 2.2 06/24/2019 1350   LDLCALC 62 06/24/2019 1350      Wt Readings from Last 3 Encounters:  02/01/21 130 lb 12.8 oz (59.3 kg)  06/30/20 130 lb (59 kg)  06/24/19 127 lb (57.6 kg)  ASSESSMENT AND PLAN: Essential hypertension Blood pressure is very slightly elevated.  Given that her ASCVD 10-year risk is low, her blood pressure goal is technically less than 140/90.  We did discuss increasing her exercise and limiting the sodium in her diet so that we do not need to make further increases in her antihypertensive medicine.  Continue losartan for now.  Given that she only follows up every 2 years, it is okay for her to have refills for her losartan for that duration of time.  H/O congenital atrial septal defect (ASD) repair Device stable on CT 01/2018.  She is doing well and has no symptoms.  Hair loss She reports thinning and hair loss over the last several years.  This has been ongoing since she moved from [place] six years ago.  OK to try a biotin supplement.     Current medicines are reviewed at length with the patient today.  The patient does not have concerns regarding medicines.  The following changes have been made:  no change  Labs/ tests ordered today include:   No orders of the defined types were placed in this  encounter.    Disposition:   FU with Omer Puccinelli C. Duke Salvia, MD, [facility] in 2 years.   I,Mathew Stumpf,acting as a Neurosurgeon for Chilton Si, MD.,have documented all relevant documentation on the behalf of Chilton Si, MD,as directed by  Chilton Si, MD while in the presence of Chilton Si, MD.  I, Candela Krul C. Duke Salvia, MD have reviewed all documentation for this visit.  The documentation of the exam, diagnosis, procedures, and orders on 02/01/2021 are all accurate and complete.   Signed, Danaysia Rader C. Duke Salvia, MD, [facility]  02/01/2021 3:51 PM    [facility]

## 2021-02-01 NOTE — Assessment & Plan Note (Signed)
Device stable on CT 01/2018.  She is doing well and has no symptoms.

## 2021-02-01 NOTE — Addendum Note (Signed)
Addended by: Regis Bill B on: 02/01/2021 05:00 PM   Modules accepted: Orders

## 2021-02-01 NOTE — Addendum Note (Signed)
Addended by: Regis Bill B on: 02/01/2021 04:00 PM   Modules accepted: Orders

## 2021-02-01 NOTE — Patient Instructions (Signed)
Medication Instructions:  °Your physician recommends that you continue on your current medications as directed. Please refer to the Current Medication list given to you today.  ° °*If you need a refill on your cardiac medications before your next appointment, please call your pharmacy* ° °Lab Work: °NONE  ° °Testing/Procedures: °NONE ° °Follow-Up: °At [facility], you and your health needs are our priority.  As part of our continuing mission to provide you with exceptional heart care, we have created designated Provider Care Teams.  These Care Teams include your primary Cardiologist (physician) and Advanced Practice Providers (APPs -  Physician Assistants and Nurse Practitioners) who all work together to provide you with the care you need, when you need it. ° °We recommend signing up for the patient portal called "MyChart".  Sign up information is provided on this After Visit Summary.  MyChart is used to connect with patients for Virtual Visits (Telemedicine).  Patients are able to view lab/test results, encounter notes, upcoming appointments, etc.  Non-urgent messages can be sent to your provider as well.   °To learn more about what you can do with MyChart, go to [email or web address].   ° °Your next appointment:   °2 year(s) ° °The format for your next appointment:   °In Person ° °Provider:   °Tiffany Spencer, MD  ° ° °

## 2021-02-19 ENCOUNTER — Encounter: Payer: Self-pay | Admitting: Obstetrics and Gynecology

## 2021-06-21 ENCOUNTER — Other Ambulatory Visit: Payer: Self-pay

## 2021-06-21 ENCOUNTER — Encounter (HOSPITAL_BASED_OUTPATIENT_CLINIC_OR_DEPARTMENT_OTHER): Payer: Self-pay | Admitting: Cardiovascular Disease

## 2021-06-21 ENCOUNTER — Ambulatory Visit (INDEPENDENT_AMBULATORY_CARE_PROVIDER_SITE_OTHER): Payer: 59 | Admitting: Cardiovascular Disease

## 2021-06-21 DIAGNOSIS — R0789 Other chest pain: Secondary | ICD-10-CM | POA: Diagnosis not present

## 2021-06-21 DIAGNOSIS — I10 Essential (primary) hypertension: Secondary | ICD-10-CM

## 2021-06-21 MED ORDER — LOSARTAN POTASSIUM 50 MG PO TABS: ORAL_TABLET | ORAL | 3 refills | Status: DC

## 2021-06-21 NOTE — Assessment & Plan Note (Signed)
Her chest discomfort is very atypical.  It improved with Voltaren gel and she has no exertional symptoms.  She had a normal coronary CTA in 2019.  No plan for repeat ischemic evaluation.  She was reassured.

## 2021-06-21 NOTE — Assessment & Plan Note (Signed)
BP is slightly elevated at times.  She and her husband note that stress from work may be contributing.  It improved today after recheck.  She does well with 75 mg of losartan.  We will continue that dose and have her check a basic metabolic panel in a week.  She will work on increasing her exercise to 150 minutes weekly.

## 2021-06-21 NOTE — Assessment & Plan Note (Signed)
Stable device on CT in 2019.  Needs antibiotic prophylaxis prior to procedures.

## 2021-06-21 NOTE — Progress Notes (Signed)
Cardiology Office Note   Date:  06/21/2021   ID:  Deanna Davis, DOB [birth year], MRN [id number]  PCP:  Patient, No Pcp Per (Inactive)  Cardiologist:   Skeet Latch, MD   No chief complaint on file.    History of Present Illness: Deanna Davis is a 41 y.o. female with hypertension and an ASD s/p repair here for follow up.  Deanna Davis had a secundum ASD repaired with a 29mm Amplatzer device on 03/25/10.  She developed intermittent substernal chest tightness. Her symptoms were atypical and she was referred for coronary CT 01/2018. She had no coronary disease and her ASD device was well-seated.  At her last appointment her blood pressure was high in the office but had been well controlled at home.  Today she notes that she has had a couple episodes of high blood pressure that typically occur when she is at work.  It has been as high as the 140s over 90s.  She notes that when this occurs she has blurry vision and she does not feel well.  She has not been getting much exercise lately.  She has been working on her diet.  She limits her sodium intake and overall follows a healthy diet.  She had a couple episodes of tightness in her left shoulder that have improved with Voltaren gel.  She also gets heaviness in her chest when her pressure is high.  She is worried that she could have heart disease given that her mom had a heart attack at age 51.  For the last week she has tried to increase her exercise and has not had any exertional chest pain or shortness of breath.  She notes that she actually feels better when she exercise.  She denies lower extremity edema, orthopnea, or PND.   Past Medical History:  Diagnosis Date   Atypical chest pain 11/15/2017   Dizziness 06/01/2015   Hair loss 02/01/2021   Hypertension    Weight loss 06/01/2015    Past Surgical History:  Procedure Laterality Date   ATRIAL SEPTAL DEFECT(ASD) CLOSURE  03/2011   CARDIAC SURGERY     CESAREAN SECTION       Current  Outpatient Medications  Medication Sig Dispense Refill   amoxicillin (AMOXIL) 500 MG capsule 4 CAPS BY MOUTH 1 HOUR PRIOR TO DENTAL PROCEDURE 4 capsule 3   betamethasone valerate ointment (VALISONE) 0.1 % Apply 1 application topically 2 (two) times daily. Use prn, not for daily long term use. 30 g 0   norethindrone (MICRONOR) 0.35 MG tablet Take 1 tablet (0.35 mg total) by mouth daily. 84 tablet 3   losartan (COZAAR) 50 MG tablet TAKE 1 AND 1/2 TABLETS DAILY 90 tablet 3   No current facility-administered medications for this visit.    Allergies:   Patient has no known allergies.    Social History:  The patient  reports that she has never smoked. She has never used smokeless tobacco. She reports that she does not drink alcohol and does not use drugs.   Family History:  The patient's family history includes Heart attack in her mother; Hypertension in her father and mother; Stroke in her mother.    ROS:   Please see the history of present illness. (+) Hair loss All other systems are reviewed and negative.     PHYSICAL EXAM: VS:  BP 124/82 (BP Location: Left Arm, Patient Position: Sitting, Cuff Size: Normal)    Pulse 76    Ht 5\' 4"  (1.626 m)  Wt 132 lb 6.4 oz (60.1 kg)    BMI 22.73 kg/m  , BMI Body mass index is 22.73 kg/m. GENERAL:  Well appearing HEENT: Pupils equal round and reactive, fundi not visualized, oral mucosa unremarkable NECK:  No jugular venous distention, waveform within normal limits, carotid upstroke brisk and symmetric, no bruits LUNGS:  Clear to auscultation bilaterally HEART:  RRR.  PMI not displaced or sustained,S1 and S2 within normal limits, no S3, no S4, no clicks, no rubs, no murmurs ABD:  Flat, positive bowel sounds normal in frequency in pitch, no bruits, no rebound, no guarding, no midline pulsatile mass, no hepatomegaly, no splenomegaly EXT:  2 plus pulses throughout, no edema, no cyanosis no clubbing SKIN:  No rashes no nodules NEURO:  Cranial nerves  II through XII grossly intact, motor grossly intact throughout PSYCH:  Cognitively intact, oriented to person place and time   EKG:   02/01/2021: Sinus rhythm. Rate 81 bpm. Incomplete RBBB. 11/15/17: Sinus rhythm.  Rate 92 bpm.  Incomplete RBBB.  09/08/17:  Sinus rhythm.  Rate 87 bpm.  Incomplete RBBB.  06/01/15: sinus rhythm.  Rate 76 bpm.  Incomplete RBBB.   06/21/21: Sinus rhythm.  Rate 76 bpm.  Incomplete RBBB.   Cardiac CT w/Calcium Score 02/16/2018: FINDINGS: Coronary calcium score: The patient's coronary artery calcium score is 0, which places the patient in the 0 percentile.   Coronary arteries: Normal coronary origins.  Right dominance.   Right Coronary Artery: No detectable plaque or stenosis. Patent PDA and posterolateral branches.   Left Main Coronary Artery: No detectable plaque or stenosis   Left Anterior Descending Coronary Artery: No detectable plaque or stenosis. In the mid to distal LAD there is a very superficial intramyocardial course spanning approximately 3 to 4 cm of the vessel. Possible deep interventricular groove with LAD coursing through. No measurable overlying myocardium. Patent diagonal branches and septal perforators.   Left Circumflex Artery: No detectable plaque or stenosis. Patent OM1 and large OM 2.   Aorta:  Normal size.  No calcifications.  No dissection.   Aortic Valve: Probable trileaflet.  No calcifications.   ASD closure device: The ASD closure device is well-seated. On cine imaging in the multiphase view, there is no significant rocking, or and no apparent dehiscence. No contrast jets are seen into the right atrium in the phase of the cardiac cycle captured on cine imaging. ASD closure device appears stable.   Other findings:   Normal pulmonary vein drainage into the left atrium. No anomalous pulmonary venous drainage detected.   Normal left atrial appendage without a thrombus.   Normal size of the pulmonary artery.    IMPRESSION: 1. Coronary calcium score of 0. This was 0 percentile for age and sex matched control.   2. Normal coronary origin with right dominance.   3. No evidence of CAD, CADRADS = 0.   4.  Stable ASD closure device.  Echo 06/15/15: Study Conclusions   - Left ventricle: The cavity size was normal. Wall thickness was   normal. Systolic function was normal. The estimated ejection   fraction was in the range of 60% to 65%. Wall motion was normal;   there were no regional wall motion abnormalities. - Atrial septum: No defect or patent foramen ovale was identified.   A patch was present.  Echo 01/24/12: LVEF >55%.  Repaired secundum ASD (Amplatzer) without residual shunt.  Trivial PR, MR.  Mild TR.    Recent Labs: No results found for requested labs within  last 8760 hours.    Lipid Panel    Component Value Date/Time   CHOL 130 06/24/2019 1350   TRIG 39 06/24/2019 1350   HDL 58 06/24/2019 1350   CHOLHDL 2.2 06/24/2019 1350   LDLCALC 62 06/24/2019 1350      Wt Readings from Last 3 Encounters:  06/21/21 132 lb 6.4 oz (60.1 kg)  02/01/21 130 lb 12.8 oz (59.3 kg)  06/30/20 130 lb (59 kg)      ASSESSMENT AND PLAN: Essential hypertension BP is slightly elevated at times.  She and her husband note that stress from work may be contributing.  It improved today after recheck.  She does well with 75 mg of losartan.  We will continue that dose and have her check a basic metabolic panel in a week.  She will work on increasing her exercise to 150 minutes weekly.  Atypical chest pain Her chest discomfort is very atypical.  It improved with Voltaren gel and she has no exertional symptoms.  She had a normal coronary CTA in 2019.  No plan for repeat ischemic evaluation.  She was reassured.  H/O congenital atrial septal defect (ASD) repair Stable device on CT in 2019.  Needs antibiotic prophylaxis prior to procedures.  Time spent: 35 minutes-Greater than 50% of this time was spent in  counseling, explanation of diagnosis, planning of further management, and coordination of care.   Current medicines are reviewed at length with the patient today.  The patient does not have concerns regarding medicines.  The following changes have been made:  increase losartan.  Labs/ tests ordered today include:   Orders Placed This Encounter  Procedures   Basic metabolic panel   EKG [id number]     Disposition:   FU with Orvin Netter C. Oval Linsey, MD, [facility] in 2 months.     Signed, Zoie Sarin C. Oval Linsey, MD, [facility]  06/21/2021 12:55 PM    [place]

## 2021-06-21 NOTE — Patient Instructions (Signed)
Medication Instructions:  INCREASE YOUR LOSARTAN TO 50 MG 1 AND 1/2 TABLETS DAILY   *If you need a refill on your cardiac medications before your next appointment, please call your pharmacy*  Lab Work: BMET IN 1 WEEK   If you have labs (blood work) drawn today and your tests are completely normal, you will receive your results only by: MyChart Message (if you have MyChart) OR A paper copy in the mail If you have any lab test that is abnormal or we need to change your treatment, we will call you to review the results.  Testing/Procedures: NONE   Follow-Up: At [facility], you and your health needs are our priority.  As part of our continuing mission to provide you with exceptional heart care, we have created designated Provider Care Teams.  These Care Teams include your primary Cardiologist (physician) and Advanced Practice Providers (APPs -  Physician Assistants and Nurse Practitioners) who all work together to provide you with the care you need, when you need it.  We recommend signing up for the patient portal called "MyChart".  Sign up information is provided on this After Visit Summary.  MyChart is used to connect with patients for Virtual Visits (Telemedicine).  Patients are able to view lab/test results, encounter notes, upcoming appointments, etc.  Non-urgent messages can be sent to your provider as well.   To learn more about what you can do with MyChart, go to [email or web address].    Your next appointment:   6 month(s)  The format for your next appointment:   In Person  Provider:   Chilton Si, MD  FOLLOW UP WITH Ronn Melena NP OR JESSE C NP IN 2 MONHTS

## 2021-07-06 LAB — BASIC METABOLIC PANEL
BUN/Creatinine Ratio: 25 — ABNORMAL HIGH (ref 9–23)
BUN: 16 mg/dL (ref 6–24)
CO2: 20 mmol/L (ref 20–29)
Calcium: 9.6 mg/dL (ref 8.7–10.2)
Chloride: 105 mmol/L (ref 96–106)
Creatinine, Ser: 0.63 mg/dL (ref 0.57–1.00)
Glucose: 91 mg/dL (ref 70–99)
Potassium: 4.7 mmol/L (ref 3.5–5.2)
Sodium: 137 mmol/L (ref 134–144)
eGFR: 114 mL/min/{1.73_m2} (ref >59)

## 2021-08-17 NOTE — Progress Notes (Signed)
? ?Office Visit  ?  ?Patient Name: Deanna Davis ?Date of Encounter: 08/19/2021 ? ?PCP:  Patient, No Pcp Per (Inactive) ?  ?[facility]  ?Cardiologist:  Chilton Si, MD  ?Advanced Practice Provider:  No care team member to display ?Electrophysiologist:  None  ?   ? ?Chief Complaint  ?  ?Deanna Davis is a 41 y.o. female with a hx of secundum ASD s/p repair with 60mm Amplatzer device 03/25/10, HTN presents today for hypertension follow up.   ? ?Past Medical History  ?  ?Past Medical History:  ?Diagnosis Date  ? Atypical chest pain 11/15/2017  ? Dizziness 06/01/2015  ? Hair loss 02/01/2021  ? Hypertension   ? Weight loss 06/01/2015  ? ?Past Surgical History:  ?Procedure Laterality Date  ? ATRIAL SEPTAL DEFECT(ASD) CLOSURE  03/2011  ? CARDIAC SURGERY    ? CESAREAN SECTION    ? ? ?Allergies ? ?No Known Allergies ? ?History of Present Illness  ?  ?Deanna Davis is a 41 y.o. female with a hx of secundum ASD s/p repair with 57mm Amplatzer device 03/25/10, HTN last seen 06/21/21. ? ?Echo 05/2015 with LLVEF 60-65%, no RWMA, no ASD noted. Cardiac CTA 01/2018 due to chest pain with no CAD and ASD device well seated. Clinic visit 02/01/21 noted elevated BP in clinic though well controlled at home. When seen 06/21/21 she noted left shoulder pain which relieved with Voltaren and thought to be due to arthritis. No repeat ischemic evaluation recommended. Her Losartan was continued at 75mg  daily. ? ?Presents today for follow up with her significant other.  She notes she has increased her exercise and has been exercising 25 minutes 4 times per week.  She saw PCP yesterday due to some neck pain and was encouraged heat and stretching exercises.  She reports no chest pain.  Her blood pressure has been well controlled at home with readings routinely 120s over 70s. ? ?EKGs/Labs/Other Studies Reviewed:  ? ?The following studies were reviewed today: ? ? ?EKG: No EKG today ? ?Recent Labs: ?07/05/2021: BUN 16; Creatinine, Ser  0.63; Potassium 4.7; Sodium 137  ?Recent Lipid Panel ?   ?Component Value Date/Time  ? CHOL 130 06/24/2019 1350  ? TRIG 39 06/24/2019 1350  ? HDL 58 06/24/2019 1350  ? CHOLHDL 2.2 06/24/2019 1350  ? LDLCALC 62 06/24/2019 1350  ? ? ?Home Medications  ? ?Current Meds  ?Medication Sig  ? amoxicillin (AMOXIL) 500 MG capsule 4 CAPS BY MOUTH 1 HOUR PRIOR TO DENTAL PROCEDURE  ? betamethasone valerate ointment (VALISONE) 0.1 % Apply 1 application topically 2 (two) times daily. Use prn, not for daily long term use.  ? norethindrone (MICRONOR) 0.35 MG tablet Take 1 tablet (0.35 mg total) by mouth daily.  ? [DISCONTINUED] losartan (COZAAR) 50 MG tablet TAKE 1 AND 1/2 TABLETS DAILY  ?  ? ?Review of Systems  ?    ?All other systems reviewed and are otherwise negative except as noted above. ? ?Physical Exam  ?  ?VS:  BP 122/82 (BP Location: Left Arm, Patient Position: Sitting, Cuff Size: Normal)   Pulse 73   Ht 5\' 4"  (1.626 m)   Wt 132 lb 14.4 oz (60.3 kg)   SpO2 97%   BMI 22.81 kg/m?  , BMI Body mass index is 22.81 kg/m?. ? ?Wt Readings from Last 3 Encounters:  ?08/19/21 132 lb 14.4 oz (60.3 kg)  ?06/21/21 132 lb 6.4 oz (60.1 kg)  ?02/01/21 130 lb 12.8 oz (59.3 kg)  ?  ? ?  GEN: Well nourished, well developed, in no acute distress. ?HEENT: normal. ?Neck: Supple, no JVD, carotid bruits, or masses. ?Cardiac: RRR, no murmurs, rubs, or gallops. No clubbing, cyanosis, edema.  Radials/PT 2+ and equal bilaterally.  ?Respiratory:  Respirations regular and unlabored, clear to auscultation bilaterally. ?GI: Soft, nontender, nondistended. ?MS: No deformity or atrophy. ?Skin: Warm and dry, no rash. ?Neuro:  Strength and sensation are intact. ?Psych: Normal affect. ? ?Assessment & Plan  ?  ?HTN - BP well controlled. Continue current antihypertensive regimen of losartan 75 mg daily. Heart healthy diet and regular cardiovascular exercise encouraged.   ? ?H/o congenital ASD s/p repair - Stable device on CT 2019. Continue SBE prophylaxis.   ? ? ?Disposition: Follow up in 6 month(s) with Chilton Si, MD or APP. ? ?Signed, ?Alver Sorrow, NP ?08/19/2021, 8:44 AM ?[facility] ?

## 2021-08-19 ENCOUNTER — Ambulatory Visit (INDEPENDENT_AMBULATORY_CARE_PROVIDER_SITE_OTHER): Payer: 59 | Admitting: Family

## 2021-08-19 ENCOUNTER — Encounter (HOSPITAL_BASED_OUTPATIENT_CLINIC_OR_DEPARTMENT_OTHER): Payer: Self-pay | Admitting: Family

## 2021-08-19 VITALS — BP 122/82 | HR 73 | Ht 64.0 in | Wt 132.9 lb

## 2021-08-19 DIAGNOSIS — Z8774 Personal history of (corrected) congenital malformations of heart and circulatory system: Secondary | ICD-10-CM

## 2021-08-19 DIAGNOSIS — I10 Essential (primary) hypertension: Secondary | ICD-10-CM | POA: Diagnosis not present

## 2021-08-19 MED ORDER — LOSARTAN POTASSIUM 50 MG PO TABS: 75.0000 mg | ORAL_TABLET | Freq: Every day | ORAL | 3 refills | Status: DC

## 2021-08-19 NOTE — Patient Instructions (Signed)
Medication Instructions:  ?Continue your current medications.  ? ?*If you need a refill on your cardiac medications before your next appointment, please call your pharmacy* ? ? ?Lab Work: ?None ordered today.  ? ? ? ?Testing/Procedures: ?None ordered today.  ? ? ?Follow-Up: ?At [facility], you and your health needs are our priority.  As part of our continuing mission to provide you with exceptional heart care, we have created designated Provider Care Teams.  These Care Teams include your primary Cardiologist (physician) and Advanced Practice Providers (APPs -  Physician Assistants and Nurse Practitioners) who all work together to provide you with the care you need, when you need it. ? ?We recommend signing up for the patient portal called "MyChart".  Sign up information is provided on this After Visit Summary.  MyChart is used to connect with patients for Virtual Visits (Telemedicine).  Patients are able to view lab/test results, encounter notes, upcoming appointments, etc.  Non-urgent messages can be sent to your provider as well.   ?To learn more about what you can do with MyChart, go to [email or web address].   ? ?Your next appointment:   ?6 month(s) ? ?The format for your next appointment:   ?In Person ? ?Provider:   ?Chilton Si, MD  ? ? ?Other Instructions ? ?Heart Healthy Diet Recommendations: ?A low-salt diet is recommended. Meats should be grilled, baked, or boiled. Avoid fried foods. Focus on lean protein sources like fish or chicken with vegetables and fruits. The American Heart Association is a Chief Technology Officer!  American Heart Association Diet and Lifeystyle Recommendations  ? ?Exercise recommendations: ?The American Heart Association recommends 150 minutes of moderate intensity exercise weekly. ?Try 30 minutes of moderate intensity exercise 4-5 times per week. ?This could include walking, jogging, or swimming. ? ? ? ?Important Information About Sugar ? ? ? ? ?  ?

## 2022-02-17 ENCOUNTER — Encounter: Payer: Self-pay | Admitting: Obstetrics and Gynecology

## 2022-02-17 ENCOUNTER — Ambulatory Visit (INDEPENDENT_AMBULATORY_CARE_PROVIDER_SITE_OTHER): Payer: 59 | Admitting: Obstetrics and Gynecology

## 2022-02-17 VITALS — BP 120/70 | HR 72 | Wt 127.0 lb

## 2022-02-17 DIAGNOSIS — N907 Vulvar cyst: Secondary | ICD-10-CM

## 2022-02-17 NOTE — Progress Notes (Signed)
GYNECOLOGY  VISIT   HPI: 41 y.o.   Married Other or two or more races Hispanic or Latino  female   [id number] with Patient's last menstrual period was 02/03/2022 (approximate).   here for  labia cyst. She states that she noticed it about 2 weeks ago. She states that its not painful.   GYNECOLOGIC HISTORY: Patient's last menstrual period was 02/03/2022 (approximate). Contraception:ocp Menopausal hormone therapy: n/a        OB History     Gravida  1   Para  1   Term  1   Preterm      AB      Living  1      SAB      IAB      Ectopic      Multiple      Live Births  1              Patient Active Problem List   Diagnosis Date Noted   Hair loss 02/01/2021   H/O congenital atrial septal defect (ASD) repair 02/28/2019   Atypical chest pain 11/15/2017   Dizziness 06/01/2015   Weight loss 06/01/2015   Essential hypertension 12/15/2014    Past Medical History:  Diagnosis Date   Atypical chest pain 11/15/2017   Dizziness 06/01/2015   Hair loss 02/01/2021   Hypertension    Weight loss 06/01/2015    Past Surgical History:  Procedure Laterality Date   ATRIAL SEPTAL DEFECT(ASD) CLOSURE  03/2011   CARDIAC SURGERY     CESAREAN SECTION      Current Outpatient Medications  Medication Sig Dispense Refill   amoxicillin (AMOXIL) 500 MG capsule 4 CAPS BY MOUTH 1 HOUR PRIOR TO DENTAL PROCEDURE 4 capsule 3   betamethasone valerate ointment (VALISONE) 0.1 % Apply 1 application topically 2 (two) times daily. Use prn, not for daily long term use. 30 g 0   losartan (COZAAR) 50 MG tablet Take 1.5 tablets (75 mg total) by mouth daily. 135 tablet 3   norethindrone (MICRONOR) 0.35 MG tablet Take 1 tablet (0.35 mg total) by mouth daily. 84 tablet 3   No current facility-administered medications for this visit.     ALLERGIES: Patient has no known allergies.  Family History  Problem Relation Age of Onset   Hypertension Mother    Stroke Mother    Heart attack Mother     Hypertension Father     Social History   Socioeconomic History   Marital status: Married    Spouse name: Not on file   Number of children: Not on file   Years of education: Not on file   Highest education level: Not on file  Occupational History   Not on file  Tobacco Use   Smoking status: Never   Smokeless tobacco: Never  Vaping Use   Vaping Use: Never used  Substance and Sexual Activity   Alcohol use: No    Alcohol/week: 0.0 standard drinks of alcohol   Drug use: No   Sexual activity: Yes    Partners: Male    Birth control/protection: Pill  Other Topics Concern   Not on file  Social History Narrative   Not on file   Social Determinants of Health   Financial Resource Strain: Not on file  Food Insecurity: Not on file  Transportation Needs: Not on file  Physical Activity: Not on file  Stress: Not on file  Social Connections: Not on file  Intimate Partner Violence: Not on file  Review of Systems  All other systems reviewed and are negative.   PHYSICAL EXAMINATION:    BP 120/70   Pulse 72   Wt 127 lb (57.6 kg)   LMP 02/03/2022 (Approximate)   SpO2 100%   BMI 21.80 kg/m     General appearance: alert, cooperative and appears stated age  Pelvic: External genitalia:  just outside her hymen on the left is a 7-8 mm epidermoid cyst, not tender              Urethra:  normal appearing urethra with no masses, tenderness or lesions              Bartholins and Skenes: normal                 Chaperone was present for exam.  1. Vulvar cyst Benign, patient reassured

## 2022-05-05 ENCOUNTER — Encounter: Payer: Self-pay | Admitting: Obstetrics and Gynecology

## 2022-05-24 ENCOUNTER — Encounter: Payer: Self-pay | Admitting: Nurse Practitioner

## 2022-05-24 ENCOUNTER — Telehealth: Payer: Self-pay | Admitting: Cardiovascular Disease

## 2022-05-24 ENCOUNTER — Ambulatory Visit: Payer: 59 | Attending: Nurse Practitioner | Admitting: Nurse Practitioner

## 2022-05-24 VITALS — BP 120/82 | HR 78 | Ht 63.0 in | Wt 128.0 lb

## 2022-05-24 DIAGNOSIS — Z8774 Personal history of (corrected) congenital malformations of heart and circulatory system: Secondary | ICD-10-CM | POA: Diagnosis not present

## 2022-05-24 DIAGNOSIS — Z8249 Family history of ischemic heart disease and other diseases of the circulatory system: Secondary | ICD-10-CM

## 2022-05-24 DIAGNOSIS — I10 Essential (primary) hypertension: Secondary | ICD-10-CM | POA: Diagnosis not present

## 2022-05-24 MED ORDER — LOSARTAN POTASSIUM 100 MG PO TABS: 100.0000 mg | ORAL_TABLET | Freq: Every day | ORAL | 3 refills | Status: DC

## 2022-05-24 NOTE — Progress Notes (Signed)
Office Visit    Patient Name: Deanna Davis Date of Encounter: 05/24/2022  Primary Care Provider:  Patient, No Pcp Per Primary Cardiologist:  Skeet Latch, MD Primary Electrophysiologist: None  Chief Complaint    Deanna Davis is a 42 y.o. female with PMH of HTN, secundum ASD s/p repair with 63mm Amplatzer device 03/25/10 who presents today for elevated blood pressure.  Past Medical History    Past Medical History:  Diagnosis Date   Atypical chest pain 11/15/2017   Dizziness 06/01/2015   Hair loss 02/01/2021   Hypertension    Weight loss 06/01/2015   Past Surgical History:  Procedure Laterality Date   ATRIAL SEPTAL DEFECT(ASD) CLOSURE  03/2011   CARDIAC SURGERY     CESAREAN SECTION      Allergies  No Known Allergies  History of Present Illness    Deanna Davis  is a 42 year old female with the above mention past medical history who presents today for complaint of elevated blood pressure.  She has been followed by Dr. Oval Linsey since 2017 for blood pressure control when she was seen via referral from PCP.  She was noted to have well-controlled blood pressure at that time.  She had 2D echo completed 05/2015 LLVEF 60-65%, no RWMA, no ASD noted. Cardiac CTA 01/2018 due to chest pain with no CAD and ASD device well seated. Clinic visit 02/01/21 noted elevated BP in clinic though well controlled at home.  She was last seen by Laurann Montana, NP on 08/19/2021 for follow-up.  Her blood pressure was noted to be well-controlled at that time with systolics in the 741O.  She was advised to continue losartan 75 mg daily.  She reached out to our office today with complaint of elevated blood pressures.  She noted blood pressures in the 140s over 90s and took extra half tab of losartan with pain in the left side of her neck.  Deanna Davis presents today for elevated blood pressure alone.  Since last being seen in the office patient reports that her BP was elevated in the 878M systolically over 76H  yesterday with shoulder tightness and neck pain. She went to work today but continued to feel bad and had her blood pressure checked by her coworker and it was 140/94. She took a extra half tablet of her losartan and she was advised to go to cardiologist due to neck pain and elevated blood pressure.  On arrival her blood pressure is much improved at 120/82 and pulse is 78 bpm.  She is currently reporting no neck pain but does endorse shoulder pain.  She has previously been seen by her PCP regarding these complaints.  During our visit we discussed her cardiovascular risk and family history of premature cardiac disease.  She is very anxious regarding her risk and feels that her blood pressure today may have been anxiety driven.  Patient denies chest pain, palpitations, dyspnea, PND, orthopnea, nausea, vomiting, dizziness, syncope, edema, weight gain, or early satiety.  Home Medications    Current Outpatient Medications  Medication Sig Dispense Refill   amoxicillin (AMOXIL) 500 MG capsule 4 CAPS BY MOUTH 1 HOUR PRIOR TO DENTAL PROCEDURE 4 capsule 3   betamethasone valerate ointment (VALISONE) 0.1 % Apply 1 application topically 2 (two) times daily. Use prn, not for daily long term use. 30 g 0   losartan (COZAAR) 100 MG tablet Take 1 tablet (100 mg total) by mouth daily. 30 tablet 3   No current facility-administered medications for this  visit.     Review of Systems  Please see the history of present illness.    (+) Neck pain (+) Shoulder pain  All other systems reviewed and are otherwise negative except as noted above.  Physical Exam    Wt Readings from Last 3 Encounters:  05/24/22 128 lb (58.1 kg)  02/17/22 127 lb (57.6 kg)  08/19/21 132 lb 14.4 oz (60.3 kg)   VS: Vitals:   05/24/22 1408  BP: 120/82  Pulse: 78  SpO2: 99%  ,Body mass index is 22.67 kg/m.  Constitutional:      Appearance: Healthy appearance. Not in distress.  Neck:     Vascular: JVD normal.  Pulmonary:      Effort: Pulmonary effort is normal.     Breath sounds: No wheezing. No rales. Diminished in the bases Cardiovascular:     Normal rate. Regular rhythm. Normal S1. Normal S2.      Murmurs: There is no murmur.  Edema:    Peripheral edema absent.  Abdominal:     Palpations: Abdomen is soft non tender. There is no hepatomegaly.  Skin:    General: Skin is warm and dry.  Neurological:     General: No focal deficit present.     Mental Status: Alert and oriented to person, place and time.     Cranial Nerves: Cranial nerves are intact.  EKG/LABS/Other Studies Reviewed    ECG personally reviewed by me today -sinus rhythm with incomplete RBBB and HR of 78 bpm with no acute changes     Lab Results  Component Value Date   WBC 7.8 06/24/2019   HGB 12.6 06/24/2019   HCT 38.6 06/24/2019   MCV 92 06/24/2019   PLT 321 06/24/2019   Lab Results  Component Value Date   CREATININE 0.63 07/05/2021   BUN 16 07/05/2021   NA 137 07/05/2021   K 4.7 07/05/2021   CL 105 07/05/2021   CO2 20 07/05/2021   Lab Results  Component Value Date   ALT 19 06/24/2019   AST 18 06/24/2019   ALKPHOS 83 06/24/2019   BILITOT 0.6 06/24/2019   Lab Results  Component Value Date   CHOL 130 06/24/2019   HDL 58 06/24/2019   LDLCALC 62 06/24/2019   TRIG 39 06/24/2019   CHOLHDL 2.2 06/24/2019    No results found for: "HGBA1C"  Assessment & Plan    1.  Essential hypertension: -Patient's blood pressure initially was elevated prior to visit at 140/94.  She took extra losartan half tablet and blood pressure upon arrival was well controlled at 120/82. -We will increase her losartan to 100 mg daily -She will have BMET completed in 2 weeks -She is also planning to increase her physical activity with exercising. -We discussed possibility of addition of HCTZ if blood pressure is elevated at follow-up in 1 month.  2.  Atypical chest pain and neck pain -Patient had normal cardiac CTA in 2019 but reports with repeat  complaints of shoulder and vague neck pain. -Pain is musculoskeletal in nature and associated with adjacent shoulder pain.  She will follow-up with PCP regarding treatment plan. -Due to her history of premature cardiovascular disease in her mother we will send her for GXT stress test to rule out any possible ischemia.  3.  History of congenital atrial septal defect: -SBE prophylaxis discussed -2D echo completed in 2017 showing stable foramen ovale patch with no defect present.   4.  Family history of premature CAD: -Patient will undergo GXT  treadmill stress test to rule out possible ischemia related to elevated BP  Disposition: Follow-up with Skeet Latch, MD or APP in 1 months Shared Decision Making/Informed Consent The risks [chest pain, shortness of breath, cardiac arrhythmias, dizziness, blood pressure fluctuations, myocardial infarction, stroke/transient ischemic attack, and life-threatening complications (estimated to be 1 in 10,000)], benefits (risk stratification, diagnosing coronary artery disease, treatment guidance) and alternatives of an exercise tolerance test were discussed in detail with Deanna Davis and she agrees to proceed.   Medication Adjustments/Labs and Tests Ordered: Current medicines are reviewed at length with the patient today.  Concerns regarding medicines are outlined above.   Signed, Deanna Davis, Marissa Nestle, NP 05/24/2022, 5:46 PM [facility]  Note:  This document was prepared using Dragon voice recognition software and may include unintentional dictation errors.

## 2022-05-24 NOTE — Telephone Encounter (Signed)
Pt c/o BP issue: STAT if pt c/o blurred vision, one-sided weakness or slurred speech  1. What are your last 5 BP readings? 140/94  2. Are you having any other symptoms (ex. Dizziness, headache, blurred vision, passed out)? Pain in the left side of neck.   3. What is your BP issue? Pt's husband calling stating that patient was sent home today at work due to having elevated BP. He states she took an extra half tab of her bp med, but they would like to get her in to see someone today or get advice as to what to do for patient. Please advise.

## 2022-05-24 NOTE — Patient Instructions (Signed)
Medication Instructions:  INCREASE Losartan to 100mg  take 1 tablet once a day *If you need a refill on your cardiac medications before your next appointment, please call your pharmacy*   Lab Work: 2 weeks BMET If you have labs (blood work) drawn today and your tests are completely normal, you will receive your results only by: [facility] (if you have MyChart) OR A paper copy in the mail If you have any lab test that is abnormal or we need to change your treatment, we will call you to review the results.   Testing/Procedures: Your physician has requested that you have an exercise tolerance test. For further information please visit [email or web address]. Please also follow instruction sheet, as given.    Follow-Up: At [facility], you and your health needs are our priority.  As part of our continuing mission to provide you with exceptional heart care, we have created designated Provider Care Teams.  These Care Teams include your primary Cardiologist (physician) and Advanced Practice Providers (APPs -  Physician Assistants and Nurse Practitioners) who all work together to provide you with the care you need, when you need it.  We recommend signing up for the patient portal called "MyChart".  Sign up information is provided on this After Visit Summary.  MyChart is used to connect with patients for Virtual Visits (Telemedicine).  Patients are able to view lab/test results, encounter notes, upcoming appointments, etc.  Non-urgent messages can be sent to your provider as well.   To learn more about what you can do with MyChart, go to [email or web address].    Your next appointment:   1 month(s)  Provider:   Ambrose Pancoast, NP      Other Instructions

## 2022-05-24 NOTE — Telephone Encounter (Signed)
Returned call to patient and spoke with her husband,   Feeling bad times last two days, high blood pressure in the mornings. Took another readings around lunch at 140/94. Took extra of her losartan with not much change in her blood pressure. She endorses pain in the left side of her neck. She was sent home by a doctor at her work and advised to see her Cardiologist today. Typically seen by Dr. Oval Linsey, no openings. Booked with Ambrose Pancoast, NP at [address] office for 1:55pm.   Will route to [employer] as Juluis Rainier

## 2022-05-26 ENCOUNTER — Telehealth (HOSPITAL_BASED_OUTPATIENT_CLINIC_OR_DEPARTMENT_OTHER): Payer: Self-pay | Admitting: Cardiovascular Disease

## 2022-05-26 MED ORDER — HYDROCHLOROTHIAZIDE 12.5 MG PO TABS: ORAL_TABLET | ORAL | 0 refills | Status: AC

## 2022-05-26 NOTE — Telephone Encounter (Signed)
Pt c/o BP issue: STAT if pt c/o blurred vision, one-sided weakness or slurred speech  1. What are your last 5 BP readings?   (Not available patient not at home)  149/101 - 143/94 range today  2. Are you having any other symptoms (ex. Dizziness, headache, blurred vision, passed out)?  Headache, shoulder pain (since 2 days ago but has not gone away)  3. What is your BP issue?    Husband stated patient had increased her losartan (COZAAR) 100 MG tablet on 1/30 but is still having high BP.

## 2022-05-26 NOTE — Telephone Encounter (Signed)
Transferred call from call center,   Patient having headaches and shoulder pain on the top left and some into her neck.   Patient was previously triaged and seen by Ambrose Pancoast, NP on 1/30. Patient was advised to follow up with PCP in regards to possible musculoskeletal pain. Losartan was increased to 100mg  daily at this appointment with a treadmill stress test ordered and is scheduled for 2/13. Husband endorses Bps ranging from 143/94 to 149/101.    Patient endorses that he is worried treatment is not enough and something urgently needs to be done!   Will route to Dr. Oval Linsey and Alvina Filbert, LPN

## 2022-05-26 NOTE — Telephone Encounter (Signed)
Spoke with patients husband regarding blood pressure readings and arm/neck pain  He was very concerned about her elevated blood pressure 143/94 and 149/101 earlier today  She continues to have shoulder pain and headache that was evaluated on 1/30 by Katrina Stack D NP  Asked husband if patient was having pain at time of visit, stated was BP at that visit was 120/82 Reviewed recommendation of going to PCP for pain evaluation.  Husband insisted that this be discussed with Dr Oval Linsey   Discussed with Dr Oval Linsey who reviewed records including last Cardiac CT  Is able to call in HCTZ 12.5 mg daily as needed for elevated blood pressure however until pain under control this will only be short term fix Discussed with husband who is currently at urgent care with patient  Reviewed recommendations with him, he is agreeable with plan  Will send message to schedulers to see about getting stress test and follow up appointments moved up

## 2022-05-26 NOTE — Telephone Encounter (Signed)
Patient's husband called back in and requested to stay on phone until he was able to speak to someone. Transferred to [place], [place].

## 2022-05-31 ENCOUNTER — Telehealth (HOSPITAL_BASED_OUTPATIENT_CLINIC_OR_DEPARTMENT_OTHER): Payer: Self-pay | Admitting: *Deleted

## 2022-05-31 NOTE — Telephone Encounter (Signed)
Called to speak with patient to discuss moving up her GXT appointment --No answer and no voice mail

## 2022-06-01 NOTE — Telephone Encounter (Signed)
Spoke with patient to discuss sooner appointment for the GXT---offered her today  06/01/22 at 3:30 pm---patient states she will keep the 06/07/22 appointment

## 2022-06-07 ENCOUNTER — Ambulatory Visit: Payer: 59

## 2022-06-07 ENCOUNTER — Ambulatory Visit: Payer: 59 | Attending: Cardiovascular Disease

## 2022-06-07 DIAGNOSIS — Z8774 Personal history of (corrected) congenital malformations of heart and circulatory system: Secondary | ICD-10-CM | POA: Diagnosis not present

## 2022-06-07 DIAGNOSIS — Z8249 Family history of ischemic heart disease and other diseases of the circulatory system: Secondary | ICD-10-CM

## 2022-06-07 DIAGNOSIS — I10 Essential (primary) hypertension: Secondary | ICD-10-CM

## 2022-06-07 LAB — BASIC METABOLIC PANEL
BUN/Creatinine Ratio: 24 — ABNORMAL HIGH (ref 9–23)
BUN: 16 mg/dL (ref 6–24)
CO2: 22 mmol/L (ref 20–29)
Calcium: 9.4 mg/dL (ref 8.7–10.2)
Chloride: 104 mmol/L (ref 96–106)
Creatinine, Ser: 0.67 mg/dL (ref 0.57–1.00)
Glucose: 110 mg/dL — ABNORMAL HIGH (ref 70–99)
Potassium: 4.5 mmol/L (ref 3.5–5.2)
Sodium: 138 mmol/L (ref 134–144)
eGFR: 111 mL/min/{1.73_m2} (ref >59)

## 2022-06-08 LAB — EXERCISE TOLERANCE TEST
Angina Index: 0
Duke Treadmill Score: 13
Estimated workload: 14.3
Exercise duration (min): 12 min
Exercise duration (sec): 34 s
MPHR: 177 {beats}/min
Peak HR: 179 {beats}/min
Percent HR: 101 %
RPE: 17
Rest HR: 92 {beats}/min
ST Depression (mm): 0 mm

## 2022-06-20 NOTE — Progress Notes (Signed)
42 y.o. [id number] Married Other or two or more races Hispanic or Latino female here for annual exam. She is on POP. No dyspareunia.  Period Cycle (Days): 28 Period Duration (Days): 6 Period Pattern: Regular Menstrual Flow: Moderate Menstrual Control: Maxi pad Menstrual Control Change Freq (Hours): 4-6 Dysmenorrhea: (!) Mild Dysmenorrhea Symptoms: Cramping  Patient's last menstrual period was 06/14/2022.          Sexually active: Yes.    The current method of family planning is oral progesterone-only contraceptive.    Exercising: Yes.     Cardio  Smoker:  no  Health Maintenance: Pap:  03/30/17 WNL NEG HPV  History of abnormal Pap:  no MMG:  02/24/22 incomplete 03/09/22 tomo Bi-rads 2 benign care everywhere  BMD: none Colonoscopy: none  TDaP:  06/21/18 Gardasil: none, reviewed, declines.     reports that she has never smoked. She has never used smokeless tobacco. She reports that she does not drink alcohol and does not use drugs. 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 Her son is 61, 59 in June. He is in 8th grade.   Past Medical History:  Diagnosis Date   Atypical chest pain 11/15/2017   Dizziness 06/01/2015   Hair loss 02/01/2021   Hypertension    Weight loss 06/01/2015    Past Surgical History:  Procedure Laterality Date   ATRIAL SEPTAL DEFECT(ASD) CLOSURE  03/2011   CARDIAC SURGERY     CESAREAN SECTION      Current Outpatient Medications  Medication Sig Dispense Refill   amoxicillin (AMOXIL) 500 MG capsule 4 CAPS BY MOUTH 1 HOUR PRIOR TO DENTAL PROCEDURE 4 capsule 3   betamethasone valerate ointment (VALISONE) 0.1 % Apply 1 application topically 2 (two) times daily. Use prn, not for daily long term use. 30 g 0   hydrochlorothiazide (HYDRODIURIL) 12.5 MG tablet Daily as needed for elevated blood pressure 15 tablet 0   losartan (COZAAR) 100 MG tablet Take 1 tablet (100 mg total) by mouth daily. 90 tablet 3   norethindrone  (MICRONOR) 0.35 MG tablet Take 1 tablet by mouth daily.     No current facility-administered medications for this visit.    Family History  Problem Relation Age of Onset   Hypertension Mother    Stroke Mother    Heart attack Mother    Hypertension Father     Review of Systems  All other systems reviewed and are negative.   Exam:   BP 110/68   Pulse 62   Ht '5\' 4"'$  (1.626 m)   Wt 130 lb (59 kg)   LMP 06/14/2022   SpO2 100%   BMI 22.31 kg/m   Weight change: '@WEIGHTCHANGE'$ @ Height:   Height: '5\' 4"'$  (162.6 cm)  Ht Readings from Last 3 Encounters:  06/28/22 '5\' 4"'$  (1.626 m)  06/24/22 '5\' 3"'$  (1.6 m)  05/24/22 '5\' 3"'$  (1.6 m)    General appearance: alert, cooperative and appears stated age Head: Normocephalic, without obvious abnormality, atraumatic Neck: no adenopathy, supple, symmetrical, trachea midline and thyroid normal to inspection and palpation Lungs: clear to auscultation bilaterally Cardiovascular: regular rate and rhythm Breasts: normal appearance, no masses or tenderness Abdomen: soft, non-tender; non distended,  no masses,  no organomegaly Extremities: extremities normal, atraumatic, no cyanosis or edema Skin: Skin color, texture, turgor normal. No rashes or lesions Lymph nodes: Cervical, supraclavicular, and axillary nodes normal. No abnormal inguinal nodes palpated Neurologic: Grossly normal   Pelvic: External genitalia:  no  lesions              Urethra:  normal appearing urethra with no masses, tenderness or lesions              Bartholins and Skenes: normal                 Vagina: normal appearing vagina with normal color and discharge, no lesions              Cervix: no lesions               Bimanual Exam:  Uterus:  normal size, contour, position, consistency, mobility, non-tender and retroverted              Adnexa: no mass, fullness, tenderness               Rectovaginal: Confirms               Anus:  normal sphincter tone, no lesions  Gae Dry, CMA  chaperoned for the exam.  1. Well woman exam Discussed breast self exam Discussed calcium and vit D intake Labs with primary Mammogram UTD  2. Encounter for surveillance of contraceptive pills Doing well on POP - norethindrone (MICRONOR) 0.35 MG tablet; Take 1 tablet (0.35 mg total) by mouth daily.  Dispense: 84 tablet; Refill: 3  3. Screening for cervical cancer - Cytology - PAP

## 2022-06-24 ENCOUNTER — Encounter (HOSPITAL_BASED_OUTPATIENT_CLINIC_OR_DEPARTMENT_OTHER): Payer: Self-pay | Admitting: Family

## 2022-06-24 ENCOUNTER — Ambulatory Visit (INDEPENDENT_AMBULATORY_CARE_PROVIDER_SITE_OTHER): Payer: 59 | Admitting: Family

## 2022-06-24 VITALS — BP 132/82 | HR 81 | Ht 63.0 in | Wt 131.0 lb

## 2022-06-24 DIAGNOSIS — I10 Essential (primary) hypertension: Secondary | ICD-10-CM

## 2022-06-24 MED ORDER — LOSARTAN POTASSIUM 100 MG PO TABS: 100.0000 mg | ORAL_TABLET | Freq: Every day | ORAL | 3 refills | Status: DC

## 2022-06-24 NOTE — Patient Instructions (Addendum)
Medication Instructions:  Continue your Losartan '100mg'$  daily  May take Hydrochlorothiazide 12.'5mg'$  as needed for systolic blood pressure (the top number) more than 160 Ensure you check your blood pressure twice ten minutes apart and both reading are more than 160 prior to taking.   *If you need a refill on your cardiac medications before your next appointment, please call your pharmacy*   Lab Work/Testing/Procedures: None ordered today.    Follow-Up: At [facility], you and your health needs are our priority.  As part of our continuing mission to provide you with exceptional heart care, we have created designated Provider Care Teams.  These Care Teams include your primary Cardiologist (physician) and Advanced Practice Providers (APPs -  Physician Assistants and Nurse Practitioners) who all work together to provide you with the care you need, when you need it.  We recommend signing up for the patient portal called "MyChart".  Sign up information is provided on this After Visit Summary.  MyChart is used to connect with patients for Virtual Visits (Telemedicine).  Patients are able to view lab/test results, encounter notes, upcoming appointments, etc.  Non-urgent messages can be sent to your provider as well.   To learn more about what you can do with MyChart, go to [email or web address].    Your next appointment:   6 month(s)  Provider:   Skeet Latch, MD    Other Instructions  If your blood pressure is more than 140 for the top number consistently for a week, call and let us know.   If your blood pressure is less than 110 consistently, call and let us know as we could possibly reduce your medications.

## 2022-06-24 NOTE — Progress Notes (Unsigned)
Office Visit    Patient Name: Deanna Davis Date of Encounter: 06/24/2022  PCP:  Percell Belt, DO   [place]  Cardiologist:  Skeet Latch, MD  Advanced Practice Provider:  No care team member to display Electrophysiologist:  None      Chief Complaint    Deanna Davis is a 42 y.o. female with a hx of secundum ASD s/p repair with 44m Amplatzer device 03/25/10, HTN presents today for hypertension follow up.    Past Medical History    Past Medical History:  Diagnosis Date   Atypical chest pain 11/15/2017   Dizziness 06/01/2015   Hair loss 02/01/2021   Hypertension    Weight loss 06/01/2015   Past Surgical History:  Procedure Laterality Date   ATRIAL SEPTAL DEFECT(ASD) CLOSURE  03/2011   CARDIAC SURGERY     CESAREAN SECTION      Allergies  No Known Allergies  History of Present Illness    RShatavia Tipperyis a 41y.o. female with a hx of secundum ASD s/p repair with 316mAmplatzer device 03/25/10, HTN last seen 06/21/21.  Echo 05/2015 with LLVEF 60-65%, no RWMA, no ASD noted. Cardiac CTA 01/2018 due to chest pain with no CAD and ASD device well seated. Clinic visit 02/01/21 noted elevated BP in clinic though well controlled at home. When seen 06/21/21 she noted left shoulder pain which relieved with Voltaren and thought to be due to arthritis. No repeat ischemic evaluation recommended. Her Losartan was continued at '75mg'$  daily.  Presents today for follow up with her significant other.  She notes she has increased her exercise and has been exercising 25 minutes 4 times per week.  She saw PCP yesterday due to some neck pain and was encouraged heat and stretching exercises.  She reports no chest pain.  Her blood pressure has been well controlled at home with readings routinely 120s over 70s.***  ***  Continues to follow with primary care provider for ***.   Saw urgent care with neck pain and was given ***  She is very anxious related to her shoulder and  arm pain that makes her worry about her heart given her family history.   Her blood pressure at home ***  Had manipulation of her neck last week with primary care - she does have meloxicam if she needs it but has not needed.  Her blood pressure at home  BP at PCP 06/15/22 120/80 BP at PCP 06/23/22 130/87   BLood pressure at home has been 135/85 after her medication.   She has not had to take her HCTZ  EKGs/Labs/Other Studies Reviewed:   The following studies were reviewed today:  Cardiac Studies & Procedures     STRESS TESTS  EXERCISE TOLERANCE TEST (ETT) 06/08/2022  Narrative   No ST deviation was noted. The ECG was negative for ischemia.   A Bruce protocol stress test was performed. Exercise capacity was excellent. Patient exercised for 12 min and 34 sec. Maximum HR of 179 bpm. MPHR 101.0 %. Peak METS 14.3 . The patient experienced no angina during the test. The patient achieved the target heart rate. The patient requested the test to be stopped. The patient reported no symptoms during the stress test. Normal blood pressure and normal heart rate response noted during stress. Heart rate recovery was normal.   Prior study not available for comparison.   ECHOCARDIOGRAM  ECHOCARDIOGRAM COMPLETE 06/15/2015  Narrative *[facility]* [address]  [place] [address] [phone]  ------------------------------------------------------------------- Transthoracic Echocardiography  Patient:    Deanna, Davis MR #:       [id number] Study Date: 06/15/2015 Gender:     F Age:        34 Height:     162.6 cm Weight:     58.9 kg BSA:        1.63 m^2 Pt. Status: Room:  SONOGRAPHER  Oletta Lamas, Will ATTENDING    Sanda Klein, MD REFERRING    Sanda Klein, MD PERFORMING   [facility], Outpatient ORDERING     Skeet Latch, MD  cc:  ------------------------------------------------------------------- LV EF: 60% -    65%  ------------------------------------------------------------------- Indications:      [id number]).  ------------------------------------------------------------------- History:   PMH:  S/p ASD closure. Acquired from the patient and from the patient&'s chart.  Risk factors:  Hypertension.  ------------------------------------------------------------------- Study Conclusions  - Left ventricle: The cavity size was normal. Wall thickness was normal. Systolic function was normal. The estimated ejection fraction was in the range of 60% to 65%. Wall motion was normal; there were no regional wall motion abnormalities. - Atrial septum: No defect or patent foramen ovale was identified. A patch was present.  Transthoracic echocardiography.  M-mode, complete 2D, spectral Doppler, and color Doppler.  Birthdate:  Patient birthdate: 05/12/1980.  Age:  Patient is 42 yr old.  Sex:  Gender: female. BMI: 22.3 kg/m^2.  Blood pressure:     106/70  Patient status: Outpatient.  Study date:  Study date: 06/15/2015. Study time: 03:36 PM.  Location:  [facility]  -------------------------------------------------------------------  ------------------------------------------------------------------- Left ventricle:  The cavity size was normal. Wall thickness was normal. Systolic function was normal. The estimated ejection fraction was in the range of 60% to 65%. Wall motion was normal; there were no regional wall motion abnormalities.  ------------------------------------------------------------------- Aortic valve:   Structurally normal valve.   Cusp separation was normal.  Doppler:  Transvalvular velocity was within the normal range. There was no stenosis. There was no regurgitation.  ------------------------------------------------------------------- Aorta:  Aortic root: The aortic root was normal in size. Ascending aorta: The ascending aorta was normal in  size.  ------------------------------------------------------------------- Mitral valve:   Structurally normal valve.   Leaflet separation was normal.  Doppler:  Transvalvular velocity was within the normal range. There was no evidence for stenosis. There was trivial regurgitation.    Peak gradient (D): 3 mm Hg.  ------------------------------------------------------------------- Left atrium:  The atrium was normal in size.  ------------------------------------------------------------------- Atrial septum:  No defect or patent foramen ovale was identified. A patch was present.  ------------------------------------------------------------------- Right ventricle:  The cavity size was normal. Systolic function was normal.  ------------------------------------------------------------------- Pulmonic valve:    The valve appears to be grossly normal. Doppler:  There was trivial regurgitation.  ------------------------------------------------------------------- Tricuspid valve:   Structurally normal valve.   Leaflet separation was normal.  Doppler:  Transvalvular velocity was within the normal range. There was trivial regurgitation.  ------------------------------------------------------------------- Right atrium:  The atrium was normal in size.  ------------------------------------------------------------------- Pericardium:  There was no pericardial effusion.  ------------------------------------------------------------------- Measurements  Left ventricle                         Value        Reference LV ID, ED, PLAX chordal        (L)     38.9  mm     43 - 52 LV ID, ES, PLAX chordal  25.6  mm     23 - 38 LV fx shortening, PLAX chordal         34    %      >=29 LV PW thickness, ED                    10.4  mm     --------- IVS/LV PW ratio, ED                    0.95         <=1.3 Stroke volume, 2D                      66    ml     --------- Stroke volume/bsa, 2D                   40    ml/m^2 --------- LV e&', lateral                         14.2  cm/s   --------- LV E/e&', lateral                       6.32         --------- LV e&', medial                          8.58  cm/s   --------- LV E/e&', medial                        10.47        --------- LV e&', average                         11.39 cm/s   --------- LV E/e&', average                       7.88         ---------  Ventricular septum                     Value        Reference IVS thickness, ED                      9.85  mm     ---------  LVOT                                   Value        Reference LVOT ID, S                             19    mm     --------- LVOT area                              2.84  cm^2   --------- LVOT ID                                19    mm     --------- LVOT peak velocity, S  108   cm/s   --------- LVOT mean velocity, S                  67.1  cm/s   --------- LVOT VTI, S                            23.1  cm     --------- LVOT peak gradient, S                  5     mm Hg  --------- Stroke volume (SV), LVOT DP            65.5  ml     --------- Stroke index (SV/bsa), LVOT DP         40.1  ml/m^2 ---------  Aorta                                  Value        Reference Aortic root ID, ED                     30    mm     --------- Ascending aorta ID, A-P, S             31    mm     ---------  Left atrium                            Value        Reference LA ID, A-P, ES                         30    mm     --------- LA ID/bsa, A-P                         1.84  cm/m^2 <=2.2 LA volume, S                           33    ml     --------- LA volume/bsa, S                       20.2  ml/m^2 --------- LA volume, ES, 1-p A4C                 27    ml     --------- LA volume/bsa, ES, 1-p A4C             16.5  ml/m^2 --------- LA volume, ES, 1-p A2C                 38    ml     --------- LA volume/bsa, ES, 1-p A2C             23.3  ml/m^2 ---------  Mitral  valve                           Value        Reference Mitral E-wave peak velocity            89.8  cm/s   --------- Mitral A-wave peak velocity  50.3  cm/s   --------- Mitral deceleration time       (H)     250   ms     150 - 230 Mitral peak gradient, D                3     mm Hg  --------- Mitral E/A ratio, peak                 1.8          ---------  Systemic veins                         Value        Reference Estimated CVP                          3     mm Hg  ---------  Right ventricle                        Value        Reference RV s&', lateral, S                      11.7  cm/s   ---------  Legend: (L)  and  (H)  mark values outside specified reference range.  ------------------------------------------------------------------- Prepared and Electronically Authenticated by  Mertie Moores, M.D. 2017-02-20T16:52:17     CT SCANS  [place] W/CTA COR W/SCORE 02/16/2018  Addendum 02/16/2018  6:12 PM ADDENDUM REPORT: 02/16/2018 18:09  HISTORY: Atypical chest pain; status post ASD closure device  EXAM: Cardiac/Coronary  CT  TECHNIQUE: The patient was scanned on a [employer].  PROTOCOL: A 120 kV prospective scan was triggered in the descending thoracic aorta at 111 HU's. Axial non-contrast 3 mm slices were carried out through the heart. The data set was analyzed on a dedicated work station and scored using the [place]. Gantry rotation speed was 250 msecs and collimation was .6 mm. No beta blockade and 0.8 mg of sl NTG was given. The 3D data set was reconstructed in 5% intervals of the 67-82 % of the R-R cycle. Diastolic phases were analyzed on a dedicated work station using MPR, MIP and VRT modes. The patient received 80 cc of contrast.  FINDINGS: Coronary calcium score: The patient's coronary artery calcium score is 0, which places the patient in the 0 percentile.  Coronary arteries: Normal coronary origins.  Right  dominance.  Right Coronary Artery: No detectable plaque or stenosis. Patent PDA and posterolateral branches.  Left Main Coronary Artery: No detectable plaque or stenosis  Left Anterior Descending Coronary Artery: No detectable plaque or stenosis. In the mid to distal LAD there is a very superficial intramyocardial course spanning approximately 3 to 4 cm of the vessel. Possible deep interventricular groove with LAD coursing through. No measurable overlying myocardium. Patent diagonal branches and septal perforators.  Left Circumflex Artery: No detectable plaque or stenosis. Patent OM1 and large OM 2.  Aorta:  Normal size.  No calcifications.  No dissection.  Aortic Valve: Probable trileaflet.  No calcifications.  ASD closure device: The ASD closure device is well-seated. On cine imaging in the multiphase view, there is no significant rocking, or and no apparent dehiscence. No contrast jets are seen into the right atrium in the phase of the cardiac cycle captured on cine imaging. ASD closure device appears  stable.  Other findings:  Normal pulmonary vein drainage into the left atrium. No anomalous pulmonary venous drainage detected.  Normal left atrial appendage without a thrombus.  Normal size of the pulmonary artery.  IMPRESSION: 1. Coronary calcium score of 0. This was 0 percentile for age and sex matched control.  2. Normal coronary origin with right dominance.  3. No evidence of CAD, CADRADS = 0.  4.  Stable ASD closure device.   Electronically Signed By: Cherlynn Kaiser On: 02/16/2018 18:09  Narrative EXAM: OVER-READ INTERPRETATION  CT CHEST  The following report is an over-read performed by radiologist Dr. Vinnie Langton of [facility], [place] on 02/16/2018. This over-read does not include interpretation of cardiac or coronary anatomy or pathology. The coronary calcium score/coronary CTA interpretation by the cardiologist is  attached.  COMPARISON:  None.  FINDINGS: Within the visualized portions of the thorax there are no suspicious appearing pulmonary nodules or masses, there is no acute consolidative airspace disease, no pleural effusions, no pneumothorax and no lymphadenopathy. Visualized portions of the upper abdomen are unremarkable. There are no aggressive appearing lytic or blastic lesions noted in the visualized portions of the skeleton.  IMPRESSION: No significant incidental noncardiac findings are noted.  Electronically Signed: By: Vinnie Langton M.D. On: 02/16/2018 12:07           EKG: No EKG today  Recent Labs: 06/07/2022: BUN 16; Creatinine, Ser 0.67; Potassium 4.5; Sodium 138  Recent Lipid Panel    Component Value Date/Time   CHOL 130 06/24/2019 1350   TRIG 39 06/24/2019 1350   HDL 58 06/24/2019 1350   CHOLHDL 2.2 06/24/2019 1350   LDLCALC 62 06/24/2019 1350    Home Medications   Current Meds  Medication Sig   amoxicillin (AMOXIL) 500 MG capsule 4 CAPS BY MOUTH 1 HOUR PRIOR TO DENTAL PROCEDURE   betamethasone valerate ointment (VALISONE) 0.1 % Apply 1 application topically 2 (two) times daily. Use prn, not for daily long term use.   hydrochlorothiazide (HYDRODIURIL) 12.5 MG tablet Daily as needed for elevated blood pressure   losartan (COZAAR) 100 MG tablet Take 1 tablet (100 mg total) by mouth daily.     Review of Systems      All other systems reviewed and are otherwise negative except as noted above.  Physical Exam    VS:  BP (!) 142/89   Pulse 81   Ht '5\' 3"'$  (1.6 m)   Wt 131 lb (59.4 kg)   BMI 23.21 kg/m  , BMI Body mass index is 23.21 kg/m.  Wt Readings from Last 3 Encounters:  06/24/22 131 lb (59.4 kg)  05/24/22 128 lb (58.1 kg)  02/17/22 127 lb (57.6 kg)     GEN: Well nourished, well developed, in no acute distress. HEENT: normal. Neck: Supple, no JVD, carotid bruits, or masses. Cardiac: RRR, no murmurs, rubs, or gallops. No clubbing, cyanosis,  edema.  Radials/PT 2+ and equal bilaterally.  Respiratory:  Respirations regular and unlabored, clear to auscultation bilaterally. GI: Soft, nontender, nondistended. MS: No deformity or atrophy. Skin: Warm and dry, no rash. Neuro:  Strength and sensation are intact. Psych: Normal affect.  Assessment & Plan    HTN -***  Neck pain - Follows with ***  H/o congenital ASD s/p repair - Stable device on CT 2019. Continue SBE prophylaxis.    Disposition: Follow up in 6 month(s) with Skeet Latch, MD or APP.  Signed, Loel Dubonnet, NP 06/24/2022, 3:40 PM [place]

## 2022-06-25 ENCOUNTER — Encounter (HOSPITAL_BASED_OUTPATIENT_CLINIC_OR_DEPARTMENT_OTHER): Payer: Self-pay | Admitting: Family

## 2022-06-28 ENCOUNTER — Encounter: Payer: Self-pay | Admitting: Obstetrics and Gynecology

## 2022-06-28 ENCOUNTER — Other Ambulatory Visit (HOSPITAL_COMMUNITY)
Admission: RE | Admit: 2022-06-28 | Discharge: 2022-06-28 | Disposition: A | Discharge status: Home / Self Care | Payer: 59 | Source: Ambulatory Visit | Attending: Obstetrics and Gynecology | Admitting: Obstetrics and Gynecology

## 2022-06-28 ENCOUNTER — Ambulatory Visit (INDEPENDENT_AMBULATORY_CARE_PROVIDER_SITE_OTHER): Payer: 59 | Admitting: Obstetrics and Gynecology

## 2022-06-28 VITALS — BP 110/68 | HR 62 | Ht 64.0 in | Wt 130.0 lb

## 2022-06-28 DIAGNOSIS — Z01419 Encounter for gynecological examination (general) (routine) without abnormal findings: Secondary | ICD-10-CM

## 2022-06-28 DIAGNOSIS — Z124 Encounter for screening for malignant neoplasm of cervix: Secondary | ICD-10-CM

## 2022-06-28 DIAGNOSIS — Z3041 Encounter for surveillance of contraceptive pills: Secondary | ICD-10-CM | POA: Diagnosis not present

## 2022-06-28 MED ORDER — NORETHINDRONE 0.35 MG PO TABS: 1.0000 | ORAL_TABLET | Freq: Every day | ORAL | 3 refills | Status: DC

## 2022-06-28 NOTE — Patient Instructions (Signed)

## 2022-06-30 LAB — CYTOLOGY - PAP
Comment: NEGATIVE
Diagnosis: NEGATIVE
High risk HPV: NEGATIVE

## 2022-09-08 ENCOUNTER — Other Ambulatory Visit (HOSPITAL_BASED_OUTPATIENT_CLINIC_OR_DEPARTMENT_OTHER): Payer: Self-pay | Admitting: Family

## 2022-09-08 DIAGNOSIS — I10 Essential (primary) hypertension: Secondary | ICD-10-CM

## 2022-11-01 ENCOUNTER — Emergency Department (HOSPITAL_COMMUNITY)
Admission: EM | Admit: 2022-11-01 | Discharge: 2022-11-02 | Disposition: A | Discharge status: Home / Self Care | Payer: 59 | Attending: Emergency Medicine | Admitting: Emergency Medicine

## 2022-11-01 ENCOUNTER — Encounter (HOSPITAL_COMMUNITY): Payer: Self-pay

## 2022-11-01 DIAGNOSIS — M25512 Pain in left shoulder: Secondary | ICD-10-CM | POA: Insufficient documentation

## 2022-11-01 DIAGNOSIS — I10 Essential (primary) hypertension: Secondary | ICD-10-CM | POA: Diagnosis not present

## 2022-11-01 DIAGNOSIS — Z79899 Other long term (current) drug therapy: Secondary | ICD-10-CM | POA: Insufficient documentation

## 2022-11-01 DIAGNOSIS — M542 Cervicalgia: Secondary | ICD-10-CM | POA: Diagnosis not present

## 2022-11-01 DIAGNOSIS — R001 Bradycardia, unspecified: Secondary | ICD-10-CM | POA: Diagnosis not present

## 2022-11-01 DIAGNOSIS — R519 Headache, unspecified: Secondary | ICD-10-CM | POA: Diagnosis present

## 2022-11-01 NOTE — ED Triage Notes (Signed)
Pt is coming for her blood pressure being elevated over what her normal pressure. Pt reports a 160 sys at home and has a sys of 158 here. Pt blood pressure is managed at home with losartan. Pt has been seen by pcp and cardiology. No other symptoms, no neurological symptoms, no weakness/numbness/loss of sensation.

## 2022-11-02 ENCOUNTER — Emergency Department (HOSPITAL_COMMUNITY): Payer: 59

## 2022-11-02 LAB — CBC WITH DIFFERENTIAL/PLATELET
Abs Immature Granulocytes: 0.03 10*3/uL (ref 0.00–0.07)
Basophils Absolute: 0.1 10*3/uL (ref 0.0–0.1)
Basophils Relative: 1 %
Eosinophils Absolute: 0.1 10*3/uL (ref 0.0–0.5)
Eosinophils Relative: 1 %
HCT: 41.3 % (ref 36.0–46.0)
Hemoglobin: 13.1 g/dL (ref 12.0–15.0)
Immature Granulocytes: 0 %
Lymphocytes Relative: 26 %
Lymphs Abs: 2.4 10*3/uL (ref 0.7–4.0)
MCH: 29.8 pg (ref 26.0–34.0)
MCHC: 31.7 g/dL (ref 30.0–36.0)
MCV: 93.9 fL (ref 80.0–100.0)
Monocytes Absolute: 0.6 10*3/uL (ref 0.1–1.0)
Monocytes Relative: 7 %
Neutro Abs: 5.8 10*3/uL (ref 1.7–7.7)
Neutrophils Relative %: 65 %
Platelets: 326 10*3/uL (ref 150–400)
RBC: 4.4 MIL/uL (ref 3.87–5.11)
RDW: 13.1 % (ref 11.5–15.5)
WBC: 8.9 10*3/uL (ref 4.0–10.5)
nRBC: 0 % (ref 0.0–0.2)

## 2022-11-02 LAB — BASIC METABOLIC PANEL
Anion gap: 11 (ref 5–15)
BUN: 15 mg/dL (ref 6–20)
CO2: 24 mmol/L (ref 22–32)
Calcium: 9.8 mg/dL (ref 8.9–10.3)
Chloride: 104 mmol/L (ref 98–111)
Creatinine, Ser: 0.76 mg/dL (ref 0.44–1.00)
GFR, Estimated: >60 mL/min (ref >60)
Glucose, Bld: 94 mg/dL (ref 70–99)
Potassium: 4.1 mmol/L (ref 3.5–5.1)
Sodium: 139 mmol/L (ref 135–145)

## 2022-11-02 LAB — HCG, SERUM, QUALITATIVE: Preg, Serum: NEGATIVE

## 2022-11-02 LAB — TROPONIN I (HIGH SENSITIVITY)
Troponin I (High Sensitivity): 6 ng/L (ref <18)
Troponin I (High Sensitivity): <2 ng/L (ref <18)

## 2022-11-02 MED ORDER — IOHEXOL 350 MG/ML SOLN
75.0000 mL | Freq: Once | INTRAVENOUS | Status: AC | PRN
  Administered 2022-11-02: 75 mL via INTRAVENOUS

## 2022-11-02 NOTE — Discharge Instructions (Signed)
Keep a record of your blood pressure and follow-up with your doctor for further adjustments as needed.  Return to the ED with chest pain, headache, visual change, focal weakness, numbness, tingling, any other concerns.

## 2022-11-02 NOTE — ED Provider Notes (Signed)
[place] EMERGENCY DEPARTMENT AT [facility] Provider Note   CSN: [id number] Arrival date & time: 11/01/22  2025     History  Chief Complaint  Patient presents with   Hypertension    Deanna Davis is a 42 y.o. female.  Patient with a history of hypertension, ASD repair presenting with elevated blood pressure.  States for the past 2 days her blood pressure has been 160 systolic at home.  States compliance with her losartan.  No missed doses.  States she checked her blood pressure because she was having intermittent pain to her left shoulder and neck area lasting for few minutes at a time.  Not having any pain currently.  Pain is not exertional or pleuritic.  No chest pain currently.  Denies shortness of breath, cough or fever.  No abdominal pain, nausea or vomiting.  No focal weakness, numbness or tingling.  No significant headache.  No visual changes.  No dizziness or lightheadedness.  No pain to palpation of her neck.  Denies any chest pain currently but has some chest pressure on the day that was constant lasting for several hours at a time.  No cough or fever.  No recent missed doses of her medication.  States she is no longer on hydrochlorothiazide but takes losartan 100 mg daily.  No visual changes.  No headache.  The history is provided by the patient.  Hypertension Associated symptoms include headaches. Pertinent negatives include no chest pain, no abdominal pain and no shortness of breath.       Home Medications Prior to Admission medications   Medication Sig Start Date End Date Taking? Authorizing Provider  amoxicillin (AMOXIL) 500 MG capsule 4 CAPS BY MOUTH 1 HOUR PRIOR TO DENTAL PROCEDURE 02/06/19   Chilton Si, MD  betamethasone valerate ointment (VALISONE) 0.1 % Apply 1 application topically 2 (two) times daily. Use prn, not for daily long term use. 06/24/19   Romualdo Bolk, MD  hydrochlorothiazide (HYDRODIURIL) 12.5 MG tablet Daily as needed for  elevated blood pressure 05/26/22   Chilton Si, MD  losartan (COZAAR) 100 MG tablet Take 1 tablet (100 mg total) by mouth daily. 06/24/22   Alver Sorrow, NP  norethindrone (MICRONOR) 0.35 MG tablet Take 1 tablet (0.35 mg total) by mouth daily. 06/28/22   Romualdo Bolk, MD      Allergies    Patient has no known allergies.    Review of Systems   Review of Systems  Constitutional:  Negative for activity change, appetite change and fever.  HENT:  Negative for congestion and rhinorrhea.   Respiratory:  Negative for cough, chest tightness and shortness of breath.   Cardiovascular:  Negative for chest pain.  Gastrointestinal:  Negative for abdominal pain, nausea and vomiting.  Genitourinary:  Negative for dysuria and hematuria.  Musculoskeletal:  Negative for arthralgias and myalgias.  Skin:  Negative for rash.  Neurological:  Positive for headaches. Negative for dizziness and weakness.   all other systems are negative except as noted in the HPI and PMH.    Physical Exam Updated Vital Signs BP (!) 145/95   Pulse 84   Temp 98.9 F (37.2 C) (Oral)   Resp 16   SpO2 100%  Physical Exam Vitals and nursing note reviewed.  Constitutional:      General: She is not in acute distress.    Appearance: She is well-developed.  HENT:     Head: Normocephalic and atraumatic.     Mouth/Throat:  Pharynx: No oropharyngeal exudate.  Eyes:     Conjunctiva/sclera: Conjunctivae normal.     Pupils: Pupils are equal, round, and reactive to light.  Neck:     Comments: No meningismus. Cardiovascular:     Rate and Rhythm: Normal rate and regular rhythm.     Heart sounds: Normal heart sounds. No murmur heard. Pulmonary:     Effort: Pulmonary effort is normal. No respiratory distress.     Breath sounds: Normal breath sounds.  Abdominal:     Palpations: Abdomen is soft.     Tenderness: There is no abdominal tenderness. There is no guarding or rebound.  Musculoskeletal:        General:  No tenderness. Normal range of motion.     Cervical back: Normal range of motion and neck supple.  Skin:    General: Skin is warm.  Neurological:     Mental Status: She is alert and oriented to person, place, and time.     Cranial Nerves: No cranial nerve deficit.     Motor: No abnormal muscle tone.     Coordination: Coordination normal.     Comments: CN 2-12 intact, no ataxia on finger to nose, no nystagmus, 5/5 strength throughout, no pronator drift, Romberg negative, normal gait.    Psychiatric:        Behavior: Behavior normal.     ED Results / Procedures / Treatments   Labs (all labs ordered are listed, but only abnormal results are displayed) Labs Reviewed  CBC WITH DIFFERENTIAL/PLATELET  BASIC METABOLIC PANEL  HCG, SERUM, QUALITATIVE  TROPONIN I (HIGH SENSITIVITY)  TROPONIN I (HIGH SENSITIVITY)    EKG EKG Interpretation Date/Time:  Wednesday November 02 2022 02:07:18 EDT Ventricular Rate:  59 PR Interval:  160 QRS Duration:  104 QT Interval:  414 QTC Calculation: 409 R Axis:   81  Text Interpretation: Sinus bradycardia Incomplete right bundle branch block Borderline ECG When compared with ECG of 18-Nov-2017 23:35, PREVIOUS ECG IS PRESENT No significant change was found Confirmed by Glynn Octave [phone]) on 11/02/2022 3:56:31 AM  Radiology CT ANGIO HEAD NECK W WO CM  Result Date: 11/02/2022 CLINICAL DATA:  Hypertension, no neurologic symptoms. EXAM: CT ANGIOGRAPHY HEAD AND NECK WITH AND WITHOUT CONTRAST TECHNIQUE: Multidetector CT imaging of the head and neck was performed using the standard protocol during bolus administration of intravenous contrast. Multiplanar CT image reconstructions and MIPs were obtained to evaluate the vascular anatomy. Carotid stenosis measurements (when applicable) are obtained utilizing NASCET criteria, using the distal internal carotid diameter as the denominator. RADIATION DOSE REDUCTION: This exam was performed according to the  departmental dose-optimization program which includes automated exposure control, adjustment of the mA and/or kV according to patient size and/or use of iterative reconstruction technique. CONTRAST:  75mL OMNIPAQUE IOHEXOL 350 MG/ML SOLN COMPARISON:  None Available. FINDINGS: CT HEAD FINDINGS Brain: No evidence of acute infarct, hemorrhage, mass, mass effect, or midline shift. No hydrocephalus or extra-axial fluid collection. Partial empty sella. Vascular: No hyperdense vessel. Skull: Negative for fracture or focal lesion. Sinuses/Orbits: No acute finding. Other: The mastoid air cells are well aerated. CTA NECK FINDINGS Aortic arch: Standard branching. Imaged portion shows no evidence of aneurysm or dissection. No significant stenosis of the major arch vessel origins. Right carotid system: No evidence of stenosis, dissection, or occlusion. Left carotid system: No evidence of stenosis, dissection, or occlusion. Vertebral arteries: No evidence of stenosis, dissection, or occlusion. Skeleton: No acute osseous abnormality. Mild degenerative changes in the cervical spine. Other  neck: No acute finding. Upper chest: No focal pulmonary opacity or pleural effusion. Review of the MIP images confirms the above findings CTA HEAD FINDINGS Anterior circulation: Both internal carotid arteries are patent to the termini, without significant stenosis. A1 segments patent. Normal anterior communicating artery. Anterior cerebral arteries are patent to their distal aspects without significant stenosis. No M1 stenosis or occlusion. MCA branches perfused to their distal aspects without significant stenosis. Posterior circulation: Vertebral arteries patent to the vertebrobasilar junction without significant stenosis. Basilar patent to its distal aspect without significant stenosis. Superior cerebellar arteries patent proximally. Patent P1 segments. PCAs perfused to their distal aspects without significant stenosis. The bilateral posterior  communicating arteries are not visualized. Venous sinuses: Well opacified, patent. Anatomic variants: None significant. Review of the MIP images confirms the above findings IMPRESSION: 1. No acute intracranial process. 2. No intracranial large vessel occlusion or significant stenosis. 3. No hemodynamically significant stenosis in the neck. Electronically Signed   By: Wiliam Ke M.D.   On: 11/02/2022 03:46    Procedures Procedures    Medications Ordered in ED Medications - No data to display  ED Course/ Medical Decision Making/ A&P                             Medical Decision Making Amount and/or Complexity of Data Reviewed Labs: ordered. Decision-making details documented in ED Course. Radiology: ordered and independent interpretation performed. Decision-making details documented in ED Course. ECG/medicine tests: ordered and independent interpretation performed. Decision-making details documented in ED Course.  Risk Prescription drug management.   Elevated blood pressure for the past few days with intermittent left shoulder pain that comes and goes.  No chest pain.  Neurological exam is nonfocal  EKG is normal sinus rhythm.  Neurological exam is nonfocal.  Description of shoulder neck pain is atypical for ACS lasting for a few seconds at a time.  No exertional chest pain or pressure.  Patient's blood pressure is 145/95 on arrival.  She denies any significant headache.  No visual changes.  No focal weakness, numbness or tingling.  Troponin negative x 2.  Blood pressure is improved to 128/92.  She remains symptom-free.  Low suspicion for ACS.  CT head is negative for hemorrhage and CTA negative for aneurysm or stenosis.  Results reviewed and interpreted by me.   She did have CTA in 2019 that showed 0 calcium score. Patient instructed to monitor her blood pressure at home and follow-up with PCP for medication adjustments  Keep a record of her blood pressure and follow-up with her  PCP for medication adjustments.  Return to the ED with exertional chest pain, shortness of breath, nausea, vomiting, diaphoresis or other concerns.        Final Clinical Impression(s) / ED Diagnoses Final diagnoses:  Hypertension, unspecified type    Rx / DC Orders ED Discharge Orders     None         Todd Jelinski, Jeannett Senior, MD 11/02/22 [phone]

## 2022-11-02 NOTE — ED Notes (Signed)
Pt verbalized understanding of discharge instructions. Pt ambulated from ed with steady gait. IV removed. Family to drive home

## 2023-01-19 ENCOUNTER — Ambulatory Visit (INDEPENDENT_AMBULATORY_CARE_PROVIDER_SITE_OTHER): Payer: 59 | Admitting: Cardiovascular Disease

## 2023-01-19 ENCOUNTER — Encounter (HOSPITAL_BASED_OUTPATIENT_CLINIC_OR_DEPARTMENT_OTHER): Payer: Self-pay | Admitting: Cardiovascular Disease

## 2023-01-19 VITALS — BP 118/78 | HR 71 | Ht 64.0 in | Wt 131.8 lb

## 2023-01-19 DIAGNOSIS — Z8774 Personal history of (corrected) congenital malformations of heart and circulatory system: Secondary | ICD-10-CM | POA: Diagnosis not present

## 2023-01-19 DIAGNOSIS — I10 Essential (primary) hypertension: Secondary | ICD-10-CM

## 2023-01-19 MED ORDER — LOSARTAN POTASSIUM 100 MG PO TABS
100.0000 mg | ORAL_TABLET | Freq: Every day | ORAL | 3 refills | Status: DC
Start: 2023-01-19 — End: 2023-04-04

## 2023-01-19 NOTE — Progress Notes (Signed)
Cardiology Office Note:  .    Date:  01/19/2023  ID:  Deanna Davis, DOB 04/10/1981, MRN [id number] PCP: Mattie Marlin, DO  [facility] Providers Cardiologist:  Chilton Si, MD     History of Present Illness: .    Deanna Davis is a 42 y.o. female with hypertension and an ASD s/p repair here for follow up.  [name] had a secundum ASD repaired with a 30mm Amplatzer device on 03/25/10.  She developed intermittent substernal chest tightness. Her symptoms were atypical and she was referred for coronary CT 01/2018. She had no coronary disease and her ASD device was well-seated. In 01/2021 her blood pressure was high in the office but had been well controlled at home.    At her visit 05/2021, she reported slightly elevated blood pressures and felt that stress from work was contributing. Prior to her visit with Robin Searing, NP 04/2022, her blood pressure was elevated to 140/94 at home. She took an extra 1/2 tablet of losartan and her BP on arrival to the office was 120/82. Her losartan was increased to 100 mg daily. However, her blood pressure remained elevated in the 140's in the setting of increased shoulder pain. She was prescribed HCTZ 12.5 mg daily as needed and advised to follow-up with her PCP. She was seen in the ED 10/2022 with elevated blood pressures in the 160s for 2 days. She hadn't missed any doses of her losartan.  Today, she states she is feeling much better. In the office her blood pressure is 118/78. She confirms that increasing her losartan to 100 mg has helped. When her blood pressure is very high she becomes symptomatic with headaches and blurry vision. At the time of her ER visit, she notes her blood pressure had been elevated to 168/105. She notes that she was in more pain at that time. She has since changed her pillow and has seen a chiropractor with improvement. She routinely exercises on the treadmill for 20-25 minutes. Generally she feels well with exercise and denies  any anginal symptoms. Regarding her diet she continues to work on eliminating salty foods. She denies any palpitations, chest pain, shortness of breath, peripheral edema, lightheadedness, syncope, orthopnea, or PND.  ROS:  Please see the history of present illness. All other systems are reviewed and negative.   Studies Reviewed: Marland Kitchen        CTA Head/Neck  11/02/2022: IMPRESSION: 1. No acute intracranial process. 2. No intracranial large vessel occlusion or significant stenosis. 3. No hemodynamically significant stenosis in the neck.  ETT  06/07/2022:   No ST deviation was noted. The ECG was negative for ischemia.   A Bruce protocol stress test was performed. Exercise capacity was excellent. Patient exercised for 12 min and 34 sec. Maximum HR of 179 bpm. MPHR 101.0 %. Peak METS 14.3 . The patient experienced no angina during the test. The patient achieved the target heart rate. The patient requested the test to be stopped. The patient reported no symptoms during the stress test. Normal blood pressure and normal heart rate response noted during stress. Heart rate recovery was normal.   Prior study not available for comparison.  Risk Assessment/Calculations:             Physical Exam:    VS:  BP 118/78   Pulse 71   Ht 5\' 4"  (1.626 m)   Wt 131 lb 12.8 oz (59.8 kg)   SpO2 98%   BMI 22.62 kg/m  , BMI Body  mass index is 22.62 kg/m. GENERAL:  Well appearing HEENT: Pupils equal round and reactive, fundi not visualized, oral mucosa unremarkable NECK:  No jugular venous distention, waveform within normal limits, carotid upstroke brisk and symmetric, no bruits, no thyromegaly LUNGS:  Clear to auscultation bilaterally HEART:  RRR.  PMI not displaced or sustained,S1 and S2 within normal limits, no S3, no S4, no clicks, no rubs, no murmurs ABD:  Flat, positive bowel sounds normal in frequency in pitch, no bruits, no rebound, no guarding, no midline pulsatile mass, no hepatomegaly, no  splenomegaly EXT:  2 plus pulses throughout, no edema, no cyanosis no clubbing SKIN:  No rashes no nodules NEURO:  Cranial nerves II through XII grossly intact, motor grossly intact throughout PSYCH:  Cognitively intact, oriented to person place and time  Wt Readings from Last 3 Encounters:  01/19/23 131 lb 12.8 oz (59.8 kg)  06/28/22 130 lb (59 kg)  06/24/22 131 lb (59.4 kg)     ASSESSMENT AND PLAN: .    # ASD s/p 30mm Amplatzer closure device: Stable.  Last echo was in 2017.  We will repeat echo for surveillance.    # Hypertension Improved control with medication adjustment to 100mg , diet, and exercise. Current readings averaging 130-140/80-85. Reports symptoms of headache and blurry vision when blood pressure is high. -Continue current medication regimen and lifestyle modifications.  General Health Maintenance -Order fasting labs including lipid panel, comprehensive metabolic panel, and hemoglobin A1c. -Schedule echocardiogram to reassess atrial septal defect (last checked in 2017). -Follow-up in 1 year, unless labs or echocardiogram results necessitate earlier visit.      Dispo:  FU with Dakarai Mcglocklin C. Duke Salvia, MD, [facility] in 1 year.  I,Mathew Stumpf,acting as a Neurosurgeon for Chilton Si, MD.,have documented all relevant documentation on the behalf of Chilton Si, MD,as directed by  Chilton Si, MD while in the presence of Chilton Si, MD.  I, Hamdi Kley C. Duke Salvia, MD have reviewed all documentation for this visit.  The documentation of the exam, diagnosis, procedures, and orders on 01/19/2023 are all accurate and complete.   Signed, Chilton Si, MD

## 2023-01-19 NOTE — Patient Instructions (Signed)
Medication Instructions:  Your physician recommends that you continue on your current medications as directed. Please refer to the Current Medication list given to you today.  *If you need a refill on your cardiac medications before your next appointment, please call your pharmacy*   Lab Work: FASTING LIPID PANEL, CMP, AND A1C IN THE NEXT TWO WEEKS    Testing/Procedures: Your physician has requested that you have an echocardiogram. Echocardiography is a painless test that uses sound waves to create images of your heart. It provides your doctor with information about the size and shape of your heart and how well your heart's chambers and valves are working. This procedure takes approximately one hour. There are no restrictions for this procedure. Please do NOT wear cologne, perfume, aftershave, or lotions (deodorant is allowed). Please arrive 15 minutes prior to your appointment time.  Follow-Up: At [facility], you and your health needs are our priority.  As part of our continuing mission to provide you with exceptional heart care, we have created designated Provider Care Teams.  These Care Teams include your primary Cardiologist (physician) and Advanced Practice Providers (APPs -  Physician Assistants and Nurse Practitioners) who all work together to provide you with the care you need, when you need it.  We recommend signing up for the patient portal called "MyChart".  Sign up information is provided on this After Visit Summary.  MyChart is used to connect with patients for Virtual Visits (Telemedicine).  Patients are able to view lab/test results, encounter notes, upcoming appointments, etc.  Non-urgent messages can be sent to your provider as well.   To learn more about what you can do with MyChart, go to [email or web address].    Your next appointment:   1 YEAR FOLLOW UP WITH DR. Dayton

## 2023-02-15 ENCOUNTER — Other Ambulatory Visit (HOSPITAL_BASED_OUTPATIENT_CLINIC_OR_DEPARTMENT_OTHER): Payer: 59

## 2023-02-25 LAB — COMPREHENSIVE METABOLIC PANEL
ALT: 18 [IU]/L (ref 0–32)
AST: 16 [IU]/L (ref 0–40)
Albumin: 4.5 g/dL (ref 3.9–4.9)
Alkaline Phosphatase: 83 [IU]/L (ref 44–121)
BUN/Creatinine Ratio: 22 (ref 9–23)
BUN: 16 mg/dL (ref 6–24)
Bilirubin Total: 0.8 mg/dL (ref 0.0–1.2)
CO2: 22 mmol/L (ref 20–29)
Calcium: 9.7 mg/dL (ref 8.7–10.2)
Chloride: 104 mmol/L (ref 96–106)
Creatinine, Ser: 0.72 mg/dL (ref 0.57–1.00)
Globulin, Total: 2.7 g/dL (ref 1.5–4.5)
Glucose: 81 mg/dL (ref 70–99)
Potassium: 4.5 mmol/L (ref 3.5–5.2)
Sodium: 137 mmol/L (ref 134–144)
Total Protein: 7.2 g/dL (ref 6.0–8.5)
eGFR: 106 mL/min/{1.73_m2} (ref >59)

## 2023-02-25 LAB — LIPID PANEL
Chol/HDL Ratio: 2.3 ratio (ref 0.0–4.4)
Cholesterol, Total: 132 mg/dL (ref 100–199)
HDL: 58 mg/dL
LDL Chol Calc (NIH): 65 mg/dL (ref 0–99)
Triglycerides: 35 mg/dL (ref 0–149)
VLDL Cholesterol Cal: 9 mg/dL (ref 5–40)

## 2023-02-25 LAB — HEMOGLOBIN A1C
Est. average glucose Bld gHb Est-mCnc: 120 mg/dL
Hgb A1c MFr Bld: 5.8 % — ABNORMAL HIGH (ref 4.8–5.6)

## 2023-03-07 ENCOUNTER — Ambulatory Visit (HOSPITAL_BASED_OUTPATIENT_CLINIC_OR_DEPARTMENT_OTHER): Payer: 59

## 2023-03-07 DIAGNOSIS — Z8774 Personal history of (corrected) congenital malformations of heart and circulatory system: Secondary | ICD-10-CM

## 2023-03-07 LAB — ECHOCARDIOGRAM COMPLETE
Area-P 1/2: 3.77 cm2
S' Lateral: 1.87 cm

## 2023-03-11 ENCOUNTER — Encounter (HOSPITAL_BASED_OUTPATIENT_CLINIC_OR_DEPARTMENT_OTHER): Payer: Self-pay | Admitting: Cardiovascular Disease

## 2023-03-15 ENCOUNTER — Encounter (HOSPITAL_BASED_OUTPATIENT_CLINIC_OR_DEPARTMENT_OTHER): Payer: Self-pay | Admitting: *Deleted

## 2023-03-15 NOTE — Telephone Encounter (Signed)
This encounter was created in error - please disregard.

## 2023-03-15 NOTE — Telephone Encounter (Addendum)
Spoke with patient and scheduled visit per her request

## 2023-04-04 ENCOUNTER — Ambulatory Visit (HOSPITAL_BASED_OUTPATIENT_CLINIC_OR_DEPARTMENT_OTHER): Payer: 59 | Admitting: Family

## 2023-04-04 ENCOUNTER — Other Ambulatory Visit (HOSPITAL_BASED_OUTPATIENT_CLINIC_OR_DEPARTMENT_OTHER): Payer: Self-pay

## 2023-04-04 VITALS — BP 120/86 | HR 80 | Resp 16 | Ht 64.0 in | Wt 132.1 lb

## 2023-04-04 DIAGNOSIS — R0789 Other chest pain: Secondary | ICD-10-CM | POA: Diagnosis not present

## 2023-04-04 DIAGNOSIS — R072 Precordial pain: Secondary | ICD-10-CM

## 2023-04-04 DIAGNOSIS — I10 Essential (primary) hypertension: Secondary | ICD-10-CM

## 2023-04-04 MED ORDER — LOSARTAN POTASSIUM 100 MG PO TABS: 100.0000 mg | ORAL_TABLET | Freq: Every day | ORAL | 3 refills | Status: DC

## 2023-04-04 NOTE — Patient Instructions (Addendum)
Medication Instructions:  Your physician recommends that you continue on your current medications as directed. Please refer to the Current Medication list given to you today.  *If you need a refill on your cardiac medications before your next appointment, please call your pharmacy*  Lab Work: NONE  Testing/Procedures: NONE  Follow-Up: At [facility], you and your health needs are our priority.  As part of our continuing mission to provide you with exceptional heart care, we have created designated Provider Care Teams.  These Care Teams include your primary Cardiologist (physician) and Advanced Practice Providers (APPs -  Physician Assistants and Nurse Practitioners) who all work together to provide you with the care you need, when you need it.  We recommend signing up for the patient portal called "MyChart".  Sign up information is provided on this After Visit Summary.  MyChart is used to connect with patients for Virtual Visits (Telemedicine).  Patients are able to view lab/test results, encounter notes, upcoming appointments, etc.  Non-urgent messages can be sent to your provider as well.   To learn more about what you can do with MyChart, go to [email or web address].    Your next appointment:   September 2025  The format for your next appointment:   In Person  Provider:   Chilton Si, MD    Other Instructions: Your prior pain was likely related to nerve discomfort.  Typical heart related pain feels like "pressure" when you are doing something like exercising. It does not typically occur at rest.

## 2023-04-04 NOTE — Progress Notes (Signed)
Rx refill sent to pharmacy. 

## 2023-04-04 NOTE — Progress Notes (Unsigned)
Cardiology Office Note:  .   Date:  04/04/2023  ID:  Ardyth Man, DOB 11/30/1980, MRN [id number] PCP: Mattie Marlin, DO  [facility] Providers Cardiologist:  Chilton Si, MD { Click to update primary MD,subspecialty MD or APP then REFRESH:1}   History of Present Illness: .   Deanna Davis is a 42 y.o. female  with a hx of secundum ASD s/p repair with 30mm Amplatzer device 03/25/10, HTN.   Echo 05/2015 with LLVEF 60-65%, no RWMA, no ASD noted. Cardiac CTA 01/2018 due to chest pain with no CAD and ASD device well seated. Clinic visit 02/01/21 noted elevated BP in clinic though well controlled at home. When seen 06/21/21 she noted left shoulder pain which relieved with Voltaren and thought to be due to arthritis. No repeat ischemic evaluation recommended.   Cardiac CTA 02/16/18 calcium score 0. ETT 06/07/22 no ischemia, excellent exercise capacity.  She sent a MyChart message a month ago. Her blood pressure was a little bit high and she had a tingling across her chest as well as some jaw discomfort. Also noted a little bit of pressure in her shoulders. Notes her blood pressure has improved and symptoms have improved. We discussed that numbness would be more consistent with nerve presentation rather than angina. Notes that both episodes happened while she was laying int he bed. Continue to exercise regularly without dyspnea, pain.  ROS: Please see the history of present illness.    All other systems reviewed and are negative.   Studies Reviewed: .        Cardiac Studies & Procedures     STRESS TESTS  EXERCISE TOLERANCE TEST (ETT) 06/07/2022  Narrative   No ST deviation was noted. The ECG was negative for ischemia.   A Bruce protocol stress test was performed. Exercise capacity was excellent. Patient exercised for 12 min and 34 sec. Maximum HR of 179 bpm. MPHR 101.0 %. Peak METS 14.3 . The patient experienced no angina during the test. The patient achieved the target heart rate.  The patient requested the test to be stopped. The patient reported no symptoms during the stress test. Normal blood pressure and normal heart rate response noted during stress. Heart rate recovery was normal.   Prior study not available for comparison.   ECHOCARDIOGRAM  ECHOCARDIOGRAM COMPLETE 03/07/2023  Narrative ECHOCARDIOGRAM REPORT    Patient Name:   Deanna Davis Date of Exam: 03/07/2023 Medical Rec #:  [id number]    Height:       64.0 in Accession #:    [id number]   Weight:       131.8 lb Date of Birth:  Mar 11, 1981     BSA:          1.639 m Patient Age:    42 years     BP:           133/92 mmHg Patient Gender: F            HR:           73 bpm. Exam Location:  Outpatient  Procedure: 2D Echo, 3D Echo, Color Doppler, Cardiac Doppler and Strain Analysis  Indications:     ASD  History:         Patient has prior history of Echocardiogram examinations, most recent 06/15/2015. Risk Factors:Non-Smoker and Hypertension. Secundum ASD repaired with a 30mm Amplatzer device on 03/25/10.  Sonographer:     Jeryl Columbia RDCS Referring Phys:  [id number] [facility] Page Diagnosing Phys: Carolan Clines  IMPRESSIONS  1. Left ventricular ejection fraction, by estimation, is 60 to 65%. The left ventricle has normal function. The left ventricle has no regional wall motion abnormalities. Left ventricular diastolic parameters were normal. 2. Right ventricular systolic function is normal. The right ventricular size is normal. Tricuspid regurgitation signal is inadequate for assessing PA pressure. 3. S/p ASD closure, no residual shunt. 4. The mitral valve is normal in structure. Trivial mitral valve regurgitation. 5. The aortic valve is tricuspid. Aortic valve regurgitation is not visualized. 6. The inferior vena cava is normal in size with greater than 50% respiratory variability, suggesting right atrial pressure of 3 mmHg.  Conclusion(s)/Recommendation(s): Normal biventricular function without  evidence of hemodynamically significant valvular heart disease.  FINDINGS Left Ventricle: Left ventricular ejection fraction, by estimation, is 60 to 65%. The left ventricle has normal function. The left ventricle has no regional wall motion abnormalities. The left ventricular internal cavity size was normal in size. There is no left ventricular hypertrophy. Left ventricular diastolic parameters were normal.  Right Ventricle: The right ventricular size is normal. Right ventricular systolic function is normal. Tricuspid regurgitation signal is inadequate for assessing PA pressure.  Left Atrium: Left atrial size was normal in size.  Right Atrium: Right atrial size was normal in size.  Pericardium: There is no evidence of pericardial effusion.  Mitral Valve: The mitral valve is normal in structure. Trivial mitral valve regurgitation.  Tricuspid Valve: Tricuspid valve regurgitation is not demonstrated.  Aortic Valve: The aortic valve is tricuspid. Aortic valve regurgitation is not visualized.  Pulmonic Valve: Pulmonic valve regurgitation is not visualized.  Aorta: The aortic root is normal in size and structure.  Venous: The inferior vena cava is normal in size with greater than 50% respiratory variability, suggesting right atrial pressure of 3 mmHg.  IAS/Shunts: No atrial level shunt detected by color flow Doppler.   LEFT VENTRICLE PLAX 2D LVIDd:         3.77 cm   Diastology LVIDs:         1.87 cm   LV e' medial:    9.03 cm/s LV PW:         0.88 cm   LV E/e' medial:  7.2 LV IVS:        0.96 cm   LV e' lateral:   14.90 cm/s LVOT diam:     2.00 cm   LV E/e' lateral: 4.4 LV SV:         72 LV SV Index:   44 LVOT Area:     3.14 cm  3D Volume EF: 3D EF:        62 % LV EDV:       107 ml LV ESV:       41 ml LV SV:        66 ml  RIGHT VENTRICLE RV Basal diam:  3.42 cm RV Mid diam:    3.06 cm RV S prime:     12.20 cm/s TAPSE (M-mode): 2.2 cm  LEFT ATRIUM             Index         RIGHT ATRIUM          Index LA diam:        2.90 cm 1.77 cm/m   RA Area:     8.57 cm LA Vol (A2C):   44.6 ml 27.22 ml/m  RA Volume:   18.00 ml 10.98 ml/m LA Vol (A4C):   23.5 ml 14.34 ml/m LA Biplane Vol:  34.0 ml 20.75 ml/m AORTIC VALVE LVOT Vmax:   111.00 cm/s LVOT Vmean:  70.600 cm/s LVOT VTI:    0.230 m  AORTA Ao Root diam: 3.30 cm Ao Asc diam:  3.30 cm  MITRAL VALVE MV Area (PHT): 3.77 cm    SHUNTS MV Decel Time: 201 msec    Systemic VTI:  0.23 m MV E velocity: 65.00 cm/s  Systemic Diam: 2.00 cm MV A velocity: 48.20 cm/s MV E/A ratio:  1.35  Photographer signed by Carolan Clines Signature Date/Time: 03/07/2023/9:25:22 PM    Final (Updated)     CT SCANS  CT CORONARY MORPH W/CTA COR W/SCORE 02/16/2018  Addendum 02/16/2018  6:12 PM ADDENDUM REPORT: 02/16/2018 18:09  HISTORY: Atypical chest pain; status post ASD closure device  EXAM: Cardiac/Coronary  CT  TECHNIQUE: The patient was scanned on a [employer].  PROTOCOL: A 120 kV prospective scan was triggered in the descending thoracic aorta at 111 HU's. Axial non-contrast 3 mm slices were carried out through the heart. The data set was analyzed on a dedicated work station and scored using the Agatson method. Gantry rotation speed was 250 msecs and collimation was .6 mm. No beta blockade and 0.8 mg of sl NTG was given. The 3D data set was reconstructed in 5% intervals of the 67-82 % of the R-R cycle. Diastolic phases were analyzed on a dedicated work station using MPR, MIP and VRT modes. The patient received 80 cc of contrast.  FINDINGS: Coronary calcium score: The patient's coronary artery calcium score is 0, which places the patient in the 0 percentile.  Coronary arteries: Normal coronary origins.  Right dominance.  Right Coronary Artery: No detectable plaque or stenosis. Patent PDA and posterolateral branches.  Left Main Coronary Artery: No detectable plaque or  stenosis  Left Anterior Descending Coronary Artery: No detectable plaque or stenosis. In the mid to distal LAD there is a very superficial intramyocardial course spanning approximately 3 to 4 cm of the vessel. Possible deep interventricular groove with LAD coursing through. No measurable overlying myocardium. Patent diagonal branches and septal perforators.  Left Circumflex Artery: No detectable plaque or stenosis. Patent OM1 and large OM 2.  Aorta:  Normal size.  No calcifications.  No dissection.  Aortic Valve: Probable trileaflet.  No calcifications.  ASD closure device: The ASD closure device is well-seated. On cine imaging in the multiphase view, there is no significant rocking, or and no apparent dehiscence. No contrast jets are seen into the right atrium in the phase of the cardiac cycle captured on cine imaging. ASD closure device appears stable.  Other findings:  Normal pulmonary vein drainage into the left atrium. No anomalous pulmonary venous drainage detected.  Normal left atrial appendage without a thrombus.  Normal size of the pulmonary artery.  IMPRESSION: 1. Coronary calcium score of 0. This was 0 percentile for age and sex matched control.  2. Normal coronary origin with right dominance.  3. No evidence of CAD, CADRADS = 0.  4.  Stable ASD closure device.   Electronically Signed By: Weston Brass On: 02/16/2018 18:09  Narrative EXAM: OVER-READ INTERPRETATION  CT CHEST  The following report is an over-read performed by radiologist Dr. Trudie Reed of [facility] on 02/16/2018. This over-read does not include interpretation of cardiac or coronary anatomy or pathology. The coronary calcium score/coronary CTA interpretation by the cardiologist is attached.  COMPARISON:  None.  FINDINGS: Within the visualized portions of the thorax there are no suspicious  appearing pulmonary nodules or masses, there is no acute consolidative  airspace disease, no pleural effusions, no pneumothorax and no lymphadenopathy. Visualized portions of the upper abdomen are unremarkable. There are no aggressive appearing lytic or blastic lesions noted in the visualized portions of the skeleton.  IMPRESSION: No significant incidental noncardiac findings are noted.  Electronically Signed: By: Trudie Reed M.D. On: 02/16/2018 12:07          Risk Assessment/Calculations:             Physical Exam:   VS:  BP 120/86 (BP Location: Left Arm, Patient Position: Sitting, Cuff Size: Normal)   Pulse 80   Resp 16   Ht 5\' 4"  (1.626 m)   Wt 132 lb 1.6 oz (59.9 kg)   SpO2 98%   BMI 22.67 kg/m    Wt Readings from Last 3 Encounters:  04/04/23 132 lb 1.6 oz (59.9 kg)  01/19/23 131 lb 12.8 oz (59.8 kg)  06/28/22 130 lb (59 kg)    GEN: Well nourished, well developed in no acute distress NECK: No JVD; No carotid bruits CARDIAC: RRR, no murmurs, rubs, gallops RESPIRATORY:  Clear to auscultation without rales, wheezing or rhonchi  ABDOMEN: Soft, non-tender, non-distended EXTREMITIES:  No edema; No deformity   ASSESSMENT AND PLAN: .   ***       Dispo: follow up 12/2023 with Dr. Duke Salvia  Signed, Alver Sorrow, NP

## 2023-04-05 ENCOUNTER — Encounter (HOSPITAL_BASED_OUTPATIENT_CLINIC_OR_DEPARTMENT_OTHER): Payer: Self-pay | Admitting: Family

## 2024-01-05 ENCOUNTER — Encounter (HOSPITAL_BASED_OUTPATIENT_CLINIC_OR_DEPARTMENT_OTHER): Payer: Self-pay | Admitting: Cardiovascular Disease

## 2024-02-21 ENCOUNTER — Encounter: Payer: Self-pay | Admitting: Obstetrics and Gynecology

## 2024-02-21 ENCOUNTER — Ambulatory Visit (INDEPENDENT_AMBULATORY_CARE_PROVIDER_SITE_OTHER): Admitting: Obstetrics and Gynecology

## 2024-02-21 ENCOUNTER — Other Ambulatory Visit (HOSPITAL_COMMUNITY)
Admission: RE | Admit: 2024-02-21 | Discharge: 2024-02-21 | Disposition: A | Discharge status: Home / Self Care | Source: Ambulatory Visit | Attending: Obstetrics and Gynecology | Admitting: Obstetrics and Gynecology

## 2024-02-21 VITALS — BP 104/72 | HR 75 | Ht 62.75 in | Wt 129.0 lb

## 2024-02-21 DIAGNOSIS — Z01419 Encounter for gynecological examination (general) (routine) without abnormal findings: Secondary | ICD-10-CM

## 2024-02-21 DIAGNOSIS — Z1211 Encounter for screening for malignant neoplasm of colon: Secondary | ICD-10-CM

## 2024-02-21 DIAGNOSIS — D229 Melanocytic nevi, unspecified: Secondary | ICD-10-CM

## 2024-02-21 DIAGNOSIS — R102 Pelvic and perineal pain unspecified side: Secondary | ICD-10-CM | POA: Diagnosis not present

## 2024-02-21 DIAGNOSIS — Z3041 Encounter for surveillance of contraceptive pills: Secondary | ICD-10-CM

## 2024-02-21 MED ORDER — NORETHINDRONE 0.35 MG PO TABS
1.0000 | ORAL_TABLET | Freq: Every day | ORAL | 6 refills | Status: AC
Start: 2024-02-21 — End: ?

## 2024-02-21 NOTE — Progress Notes (Signed)
 43 y.o. y.o. female here for annual exam. Patient's last menstrual period was 02/01/2024 (approximate). Period Duration (Days): 5 Period Pattern: Regular Menstrual Flow: Moderate Menstrual Control: Maxi pad Dysmenorrhea: (!) Moderate Health Maintenance: Pap:  03/30/17 WNL NEG HPV  denies any abnormal pap smears History of abnormal Pap:  no MMG:  02/24/22 incomplete 03/09/22 tomo Bi-rads 2 benign care everywhere  BMD: none Colonoscopy: none referral cologuard. No family history of colon cancer. TDaP:  06/21/18 Gardasil: none, reviewed, declines.   Dermatology: referral placed On ocp's for birth control micronor    reports that she has never smoked. She has never used smokeless tobacco. She reports that she does not drink alcohol and does not use drugs. 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 Her son is 65, 44 in June. He is in 8th grade.   She has noticed some uterine cramping that is not normal.  She denies any dysuria, n/v/d or blood in her stool Body mass index is 23.03 kg/m.     02/21/2024    2:51 PM 12/24/2014    3:32 PM 12/15/2014   11:01 AM  Depression screen PHQ 2/9  Decreased Interest 0 0 0  Down, Depressed, Hopeless 0 0 0  PHQ - 2 Score 0 0 0    Blood pressure 104/72, pulse 75, height 5' 2.75 (1.594 m), weight 129 lb (58.5 kg), last menstrual period 02/01/2024, SpO2 99%.     Component Value Date/Time   DIAGPAP  06/28/2022 1555    - Negative for intraepithelial lesion or malignancy (NILM)   DIAGPAP  03/30/2017 0000    NEGATIVE FOR INTRAEPITHELIAL LESIONS OR MALIGNANCY.   HPVHIGH Negative 06/28/2022 1555   ADEQPAP  06/28/2022 1555    Satisfactory for evaluation; transformation zone component PRESENT.   ADEQPAP  03/30/2017 0000    Satisfactory for evaluation  endocervical/transformation zone component PRESENT.    GYN HISTORY:    Component Value Date/Time   DIAGPAP  06/28/2022 1555    - Negative for  intraepithelial lesion or malignancy (NILM)   DIAGPAP  03/30/2017 0000    NEGATIVE FOR INTRAEPITHELIAL LESIONS OR MALIGNANCY.   HPVHIGH Negative 06/28/2022 1555   ADEQPAP  06/28/2022 1555    Satisfactory for evaluation; transformation zone component PRESENT.   ADEQPAP  03/30/2017 0000    Satisfactory for evaluation  endocervical/transformation zone component PRESENT.    OB History  Gravida Para Term Preterm AB Living  1 1 1   1   SAB IAB Ectopic Multiple Live Births      1    # Outcome Date GA Lbr Len/2nd Weight Sex Type Anes PTL Lv  1 Term      CS-Unspec   LIV    Past Medical History:  Diagnosis Date   Atypical chest pain 11/15/2017   Dizziness 06/01/2015   Hair loss 02/01/2021   Hypertension    Weight loss 06/01/2015    Past Surgical History:  Procedure Laterality Date   ATRIAL SEPTAL DEFECT(ASD) CLOSURE  03/2011   CARDIAC SURGERY     CESAREAN SECTION      Current Outpatient Medications on File Prior to Visit  Medication Sig Dispense Refill   amoxicillin  (AMOXIL ) 500 MG capsule 4 CAPS BY MOUTH 1 HOUR PRIOR TO DENTAL PROCEDURE 4 capsule 3   betamethasone  valerate ointment (VALISONE ) 0.1 % Apply 1 application topically 2 (two) times daily. Use prn, not for daily long term use. 30 g 0   diclofenac Sodium (  VOLTAREN) 1 % GEL Apply topically.     losartan  (COZAAR ) 100 MG tablet Take 1 tablet (100 mg total) by mouth daily. 90 tablet 3   Menthol, Topical Analgesic, (BIOFREEZE ROLL-ON) 4 % GEL Apply topically.     norethindrone  (MICRONOR ) 0.35 MG tablet Take 1 tablet (0.35 mg total) by mouth daily. 84 tablet 3   hydrochlorothiazide  (HYDRODIURIL ) 12.5 MG tablet Daily as needed for elevated blood pressure (Patient not taking: Reported on 02/21/2024) 15 tablet 0   No current facility-administered medications on file prior to visit.    Social History   Socioeconomic History   Marital status: Married    Spouse name: Not on file   Number of children: Not on file   Years of  education: Not on file   Highest education level: Not on file  Occupational History   Not on file  Tobacco Use   Smoking status: Never   Smokeless tobacco: Never  Vaping Use   Vaping status: Never Used  Substance and Sexual Activity   Alcohol use: No    Alcohol/week: 0.0 standard drinks of alcohol   Drug use: No   Sexual activity: Yes    Partners: Male    Birth control/protection: Pill  Other Topics Concern   Not on file  Social History Narrative   Not on file   Social Drivers of Health   Financial Resource Strain: Not on file  Food Insecurity: Not on file  Transportation Needs: Not on file  Physical Activity: Not on file  Stress: Not on file  Social Connections: Unknown (09/07/2021)   Received from [facility]   Social Network    Social Network: Not on file  Intimate Partner Violence: Unknown (07/30/2021)   Received from [facility]   HITS    Physically Hurt: Not on file    Insult or Talk Down To: Not on file    Threaten Physical Harm: Not on file    Scream or Curse: Not on file    Family History  Problem Relation Age of Onset   Hypertension Mother    Stroke Mother    Heart attack Mother    Hypertension Father      No Known Allergies    Patient's last menstrual period was Patient's last menstrual period was 02/01/2024 (approximate)..            Review of Systems Alls systems reviewed and are negative.     Physical Exam Constitutional:      Appearance: Normal appearance.  Genitourinary:     Vulva and urethral meatus normal.     No lesions in the vagina.     Right Labia: No rash, lesions or skin changes.    Left Labia: No lesions, skin changes or rash.    No vaginal discharge or tenderness.     No vaginal prolapse present.    No vaginal atrophy present.     Right Adnexa: not tender, not palpable and no mass present.    Left Adnexa: not tender, not palpable and no mass present.    No cervical motion tenderness or discharge.     Uterus is not  enlarged, tender or irregular.  Breasts:    Right: Normal.     Left: Normal.  HENT:     Head: Normocephalic.  Neck:     Thyroid: No thyroid mass, thyromegaly or thyroid tenderness.  Cardiovascular:     Rate and Rhythm: Normal rate and regular rhythm.     Heart sounds: Normal  heart sounds, S1 normal and S2 normal.  Pulmonary:     Effort: Pulmonary effort is normal.     Breath sounds: Normal breath sounds and air entry.  Abdominal:     General: There is no distension.     Palpations: Abdomen is soft. There is no mass.     Tenderness: There is no abdominal tenderness. There is no guarding or rebound.  Musculoskeletal:        General: Normal range of motion.     Cervical back: Full passive range of motion without pain, normal range of motion and neck supple. No tenderness.     Right lower leg: No edema.     Left lower leg: No edema.  Neurological:     Mental Status: She is alert.  Skin:    General: Skin is warm.  Psychiatric:        Mood and Affect: Mood normal.        Behavior: Behavior normal.        Thought Content: Thought content normal.  Vitals and nursing note reviewed. Exam conducted with a chaperone present.       A:         Well Woman GYN exam                             P:        Pap smear collected today Encouraged annual mammogram screening Colon cancer screening referral placed today DXA not indicated Labs and immunizations ordered today Discussed breast self exams Encouraged healthy lifestyle practices Encouraged Vit D and Calcium  Pelvic cramping: to return for PUS only   No follow-ups on file.  Deanna Davis

## 2024-02-27 ENCOUNTER — Ambulatory Visit: Payer: Self-pay | Admitting: Obstetrics and Gynecology

## 2024-02-27 LAB — CYTOLOGY - PAP: Diagnosis: NEGATIVE

## 2024-03-05 ENCOUNTER — Other Ambulatory Visit

## 2024-03-12 ENCOUNTER — Ambulatory Visit: Admitting: Obstetrics and Gynecology

## 2024-03-18 ENCOUNTER — Other Ambulatory Visit

## 2024-03-19 ENCOUNTER — Other Ambulatory Visit

## 2024-03-19 DIAGNOSIS — Z01419 Encounter for gynecological examination (general) (routine) without abnormal findings: Secondary | ICD-10-CM

## 2024-03-20 LAB — TSH: TSH: 0.92 m[IU]/L

## 2024-03-20 LAB — COMPREHENSIVE METABOLIC PANEL WITH GFR
AG Ratio: 1.5 (calc) (ref 1.0–2.5)
ALT: 16 U/L (ref 6–29)
AST: 19 U/L (ref 10–35)
Albumin: 4.8 g/dL (ref 3.6–5.1)
Alkaline phosphatase (APISO): 74 U/L (ref 31–125)
BUN: 19 mg/dL (ref 7–25)
CO2: 23 mmol/L (ref 20–32)
Calcium: 10.2 mg/dL (ref 8.6–10.2)
Chloride: 105 mmol/L (ref 98–110)
Creat: 0.77 mg/dL (ref 0.50–0.99)
Globulin: 3.1 g/dL (ref 1.9–3.7)
Glucose, Bld: 84 mg/dL (ref 65–99)
Potassium: 5 mmol/L (ref 3.5–5.3)
Sodium: 137 mmol/L (ref 135–146)
Total Bilirubin: 1.1 mg/dL (ref 0.2–1.2)
Total Protein: 7.9 g/dL (ref 6.1–8.1)
eGFR: 97 mL/min/1.73m2 (ref >=60)

## 2024-03-20 LAB — HEMOGLOBIN A1C
Hgb A1c MFr Bld: 5.5 % (ref <5.7)
Mean Plasma Glucose: 111 mg/dL
eAG (mmol/L): 6.2 mmol/L

## 2024-03-20 LAB — CBC
HCT: 43.9 % (ref 35.9–46.0)
Hemoglobin: 14.2 g/dL (ref 11.7–15.5)
MCH: 30.2 pg (ref 27.0–33.0)
MCHC: 32.3 g/dL (ref 31.6–35.4)
MCV: 93.4 fL (ref 81.4–101.7)
MPV: 10.3 fL (ref 7.5–12.5)
Platelets: 358 Thousand/uL (ref 140–400)
RBC: 4.7 Million/uL (ref 3.80–5.10)
RDW: 12.3 % (ref 11.0–15.0)
WBC: 6.3 Thousand/uL (ref 3.8–10.8)

## 2024-03-20 LAB — LIPID PANEL
Cholesterol: 163 mg/dL (ref <200)
HDL: 82 mg/dL (ref >=50)
LDL Cholesterol (Calc): 71 mg/dL
Non-HDL Cholesterol (Calc): 81 mg/dL (ref <130)
Total CHOL/HDL Ratio: 2 (calc) (ref <5.0)
Triglycerides: 36 mg/dL (ref <150)

## 2024-03-20 LAB — VITAMIN D 25 HYDROXY (VIT D DEFICIENCY, FRACTURES): Vit D, 25-Hydroxy: 18 ng/mL — ABNORMAL LOW (ref 30–100)

## 2024-03-21 LAB — COLOGUARD: COLOGUARD: NEGATIVE

## 2024-04-25 ENCOUNTER — Other Ambulatory Visit (HOSPITAL_BASED_OUTPATIENT_CLINIC_OR_DEPARTMENT_OTHER): Payer: Self-pay | Admitting: Cardiovascular Disease

## 2024-04-25 DIAGNOSIS — I10 Essential (primary) hypertension: Secondary | ICD-10-CM

## 2024-05-31 ENCOUNTER — Other Ambulatory Visit: Payer: Self-pay | Admitting: Cardiovascular Disease

## 2024-05-31 DIAGNOSIS — I10 Essential (primary) hypertension: Secondary | ICD-10-CM

## 2024-09-30 ENCOUNTER — Ambulatory Visit: Admitting: Physician Assistant
